# Patient Record
Sex: Male | Born: 1975 | Race: White | Hispanic: No | Marital: Married | Smoking: Never smoker
Health system: Southern US, Community
[De-identification: ages and names within clinical notes are randomized; demographics above are authoritative.]

## PROBLEM LIST (undated history)

## (undated) DIAGNOSIS — E119 Type 2 diabetes mellitus without complications: Secondary | ICD-10-CM

## (undated) DIAGNOSIS — E7800 Pure hypercholesterolemia, unspecified: Secondary | ICD-10-CM

## (undated) DIAGNOSIS — I10 Essential (primary) hypertension: Secondary | ICD-10-CM

---

## 2014-08-13 ENCOUNTER — Emergency Department (HOSPITAL_COMMUNITY): Payer: BC Managed Care – PPO

## 2014-08-13 ENCOUNTER — Encounter (HOSPITAL_COMMUNITY): Payer: Self-pay | Admitting: *Deleted

## 2014-08-13 ENCOUNTER — Emergency Department (HOSPITAL_COMMUNITY)
Admission: EM | Admit: 2014-08-13 | Discharge: 2014-08-13 | Disposition: A | Discharge status: Home / Self Care | Payer: BC Managed Care – PPO | Attending: Emergency Medicine | Admitting: Emergency Medicine

## 2014-08-13 DIAGNOSIS — L03116 Cellulitis of left lower limb: Secondary | ICD-10-CM | POA: Diagnosis present

## 2014-08-13 DIAGNOSIS — I10 Essential (primary) hypertension: Secondary | ICD-10-CM | POA: Insufficient documentation

## 2014-08-13 DIAGNOSIS — E11628 Type 2 diabetes mellitus with other skin complications: Secondary | ICD-10-CM | POA: Insufficient documentation

## 2014-08-13 DIAGNOSIS — L03032 Cellulitis of left toe: Secondary | ICD-10-CM | POA: Diagnosis not present

## 2014-08-13 DIAGNOSIS — L03039 Cellulitis of unspecified toe: Secondary | ICD-10-CM

## 2014-08-13 DIAGNOSIS — L03012 Cellulitis of left finger: Secondary | ICD-10-CM

## 2014-08-13 DIAGNOSIS — L089 Local infection of the skin and subcutaneous tissue, unspecified: Secondary | ICD-10-CM | POA: Diagnosis not present

## 2014-08-13 DIAGNOSIS — L0390 Cellulitis, unspecified: Secondary | ICD-10-CM

## 2014-08-13 HISTORY — DX: Essential (primary) hypertension: I10

## 2014-08-13 HISTORY — DX: Type 2 diabetes mellitus without complications: E11.9

## 2014-08-13 LAB — BASIC METABOLIC PANEL
Anion gap: 7 (ref 5–15)
BUN: 11 mg/dL (ref 6–23)
CALCIUM: 8.9 mg/dL (ref 8.4–10.5)
CO2: 28 mmol/L (ref 19–32)
CREATININE: 0.64 mg/dL (ref 0.50–1.35)
Chloride: 100 mmol/L (ref 96–112)
GFR calc Af Amer: >90 mL/min (ref >90)
Glucose, Bld: 261 mg/dL — ABNORMAL HIGH (ref 70–99)
Potassium: 4.3 mmol/L (ref 3.5–5.1)
Sodium: 135 mmol/L (ref 135–145)

## 2014-08-13 LAB — C-REACTIVE PROTEIN: CRP: 0.5 mg/dL — ABNORMAL LOW (ref <0.60)

## 2014-08-13 LAB — CBC WITH DIFFERENTIAL/PLATELET
Basophils Absolute: 0 10*3/uL (ref 0.0–0.1)
Basophils Relative: 0 % (ref 0–1)
Eosinophils Absolute: 0.1 10*3/uL (ref 0.0–0.7)
Eosinophils Relative: 2 % (ref 0–5)
HCT: 42.7 % (ref 39.0–52.0)
Hemoglobin: 14.9 g/dL (ref 13.0–17.0)
Lymphocytes Relative: 31 % (ref 12–46)
Lymphs Abs: 1.9 10*3/uL (ref 0.7–4.0)
MCH: 29.4 pg (ref 26.0–34.0)
MCHC: 34.9 g/dL (ref 30.0–36.0)
MCV: 84.4 fL (ref 78.0–100.0)
MONOS PCT: 7 % (ref 3–12)
Monocytes Absolute: 0.4 10*3/uL (ref 0.1–1.0)
NEUTROS ABS: 3.8 10*3/uL (ref 1.7–7.7)
Neutrophils Relative %: 60 % (ref 43–77)
Platelets: 248 10*3/uL (ref 150–400)
RBC: 5.06 MIL/uL (ref 4.22–5.81)
RDW: 12.3 % (ref 11.5–15.5)
WBC: 6.3 10*3/uL (ref 4.0–10.5)

## 2014-08-13 LAB — SEDIMENTATION RATE: Sed Rate: 10 mm/hr (ref 0–16)

## 2014-08-13 LAB — CBG MONITORING, ED: Glucose-Capillary: 241 mg/dL — ABNORMAL HIGH (ref 70–99)

## 2014-08-13 MED ORDER — PIPERACILLIN-TAZOBACTAM 3.375 G IVPB
3.3750 g | Freq: Once | INTRAVENOUS | Status: AC
  Administered 2014-08-13: 3.375 g via INTRAVENOUS
  Filled 2014-08-13: qty 50

## 2014-08-13 MED ORDER — LIDOCAINE HCL (PF) 1 % IJ SOLN
5.0000 mL | Freq: Once | INTRAMUSCULAR | Status: AC
  Administered 2014-08-13: 5 mL
  Filled 2014-08-13: qty 5

## 2014-08-13 MED ORDER — AMOXICILLIN-POT CLAVULANATE 875-125 MG PO TABS: 1.0000 | ORAL_TABLET | Freq: Two times a day (BID) | ORAL | Status: DC

## 2014-08-13 MED ORDER — CIPROFLOXACIN HCL 500 MG PO TABS: 500.0000 mg | ORAL_TABLET | Freq: Two times a day (BID) | ORAL | Status: DC

## 2014-08-13 MED ORDER — POVIDONE-IODINE 10 % EX SOLN
CUTANEOUS | Status: AC
  Filled 2014-08-13: qty 118

## 2014-08-13 MED ORDER — METFORMIN HCL 500 MG PO TABS: 500.0000 mg | ORAL_TABLET | Freq: Two times a day (BID) | ORAL | Status: AC

## 2014-08-13 MED ORDER — TETANUS-DIPHTH-ACELL PERTUSSIS 5-2.5-18.5 LF-MCG/0.5 IM SUSP
0.5000 mL | Freq: Once | INTRAMUSCULAR | Status: DC
  Filled 2014-08-13: qty 0.5

## 2014-08-13 MED ORDER — VANCOMYCIN HCL IN DEXTROSE 1-5 GM/200ML-% IV SOLN
1000.0000 mg | Freq: Once | INTRAVENOUS | Status: AC
  Administered 2014-08-13: 1000 mg via INTRAVENOUS
  Filled 2014-08-13: qty 200

## 2014-08-13 NOTE — Consult Note (Addendum)
Hospitalist Consult Note  Patient name: Jared Soto Medical record number: [id number] Date of birth: August 19, 1975 Age: 39 y.o. Gender: male  Primary Care Provider: No primary care provider on file.  Chief Complaint: paronychia, hyperglycemia   History of Present Illness:This is a 40 y.o. year old male with significant past medical history of obesity, type 2 DM presenting with paronychia. Pt states that a piece of plastic got stuck in his toe about 1 month ago. Was fairly stable for a couple of weeks. Reports worsnening redness of toe over past 2 weeks. Has been draining wound at home. No fevers, chills, pain. Has not been on antibiotics. Was previously diagnosed with type 2 diabetes 2 years ago. States that he was previously on medication and was transitioned to diet and lifestyle. States that he has had worsening diet over past 6-12 months. Is a pt at [facility]. Was directed here out of concern for cellulitis/abscess.  Presented to [facility] ER hemodynamically stable, Afebrile. WBC 6.3. Cr 0.64. Glu 261. R Great toe xray WNL-no signs of osteomyelitis. ESR WNL. Nail bed I and D'd at bedside by EDP w/ minimal drainage. Started on IV vanc and zosyn.   Assessment and Plan:  Jared Soto is a 39 y.o. year old male presenting with paronychia, hyperglycemia  Active Problems:   * No active hospital problems. *   1- Paronychia  -S/p manual I and D at bedside  -no elevated temp, leukocystosis, elevated ESR -R great toe xray WNL-no signs of osteo  -s/p IV vanc and zosyn  -discussed overall care w/ pt. He would like to go home. Is deferring admission at this time if at all possible.  -pt also reports that his wife is a home healthy nurse that deals w/ infections and IV management on a regular basis-feels comfortable going home.  -pt is agreeable to follow up first thing tomorrow morning at [facility]  -also discussed care and treatment plan with EDP Rancour  -plan for tx w/ oral  augmentin +/- cipro  -follow up tomorrow am at PCP office  -discussed overall risks and benefits with pt-pt is agreeable to plan   2- Hyperglycemia -noted CBG 261 -bicarb WNL  -start low dose metformin in ER -follow up in am   3- HTN -elevated blood pressures on presentation -intermittent  -asymptomatic -start low dose lisinopril  -UA, urine microalbumin  -follow up w/ PCP in am  -recheck Cr   There are no active problems to display for this patient.  Past Medical History: Past Medical History  Diagnosis Date  . Diabetes mellitus without complication   . Hypertension     Past Surgical History: History reviewed. No pertinent past surgical history.  Social History: History   Social History  . Marital Status: Married    Spouse Name: N/A    Number of Children: N/A  . Years of Education: N/A   Social History Main Topics  . Smoking status: Never Smoker   . Smokeless tobacco: None  . Alcohol Use: No  . Drug Use: No  . Sexual Activity: None   Other Topics Concern  . None   Social History Narrative  . None    Family History: History reviewed. No pertinent family history.  Allergies: No Known Allergies  Current Facility-Administered Medications  Medication Dose Route Frequency Provider Last Rate Last Dose  . povidone-iodine (BETADINE) 10 % external solution           . Tdap (BOOSTRIX) injection 0.5 mL  0.5 mL Intramuscular Once Ezequiel Essex, MD   0.5 mL at 08/13/14 1808   No current outpatient prescriptions on file.   Review Of Systems: 12 point ROS negative except as noted above in HPI.  Physical Exam: Filed Vitals:   08/13/14 1841  BP: 158/90  Pulse: 94  Temp:   Resp: 18    General: alert, cooperative and moderately obese HEENT: PERRLA and extra ocular movement intact Heart: S1, S2 normal, no murmur, rub or gallop, regular rate and rhythm Lungs: clear to auscultation, no wheezes or rales and unlabored breathing Abdomen: abdomen is soft  without significant tenderness, masses, organomegaly or guarding Extremities: 2+ peripheral pulses    Skin:as above  Neurology: normal without focal findings  Labs and Imaging: Lab Results  Component Value Date/Time   NA 135 08/13/2014 04:39 PM   K 4.3 08/13/2014 04:39 PM   CL 100 08/13/2014 04:39 PM   CO2 28 08/13/2014 04:39 PM   BUN 11 08/13/2014 04:39 PM   CREATININE 0.64 08/13/2014 04:39 PM   GLUCOSE 261* 08/13/2014 04:39 PM   Lab Results  Component Value Date   WBC 6.3 08/13/2014   HGB 14.9 08/13/2014   HCT 42.7 08/13/2014   MCV 84.4 08/13/2014   PLT 248 08/13/2014    Dg Toe Great Right  08/13/2014   CLINICAL DATA:  Lead serration of the great toe ulna piece of plastic 1 month ago with persistent symptoms of infection with drainage of pus from under the nail ; patient is a diabetic  EXAM: RIGHT GREAT TOE  COMPARISON:  None.  FINDINGS: The bones of the great toe are adequately mineralized. There is no lytic or blastic lesion. There is no periosteal reaction. No soft tissue gas is demonstrated. The soft tissues exhibit only mild swelling. The interphalangeal and metatarsophalangeal joint are normal.  IMPRESSION: There is no evidence of osteomyelitis. No soft tissue gas is demonstrated either.   Electronically Signed   By: David  Martinique   On: 08/13/2014 16:51           Shanda Howells MD  Pager: [phone]

## 2014-08-13 NOTE — ED Provider Notes (Signed)
CSN: [id number]     Arrival date & time 08/13/14  1515 History   First MD Initiated Contact with Patient 08/13/14 1625     Chief Complaint  Patient presents with  . Cellulitis     (Consider location/radiation/quality/duration/timing/severity/associated sxs/prior Treatment) HPI Comments: Patient states he cut his right great toe on a piece of plastic about one month ago. Over the past week it has been increasingly red and draining yellow pus. Sent from urgent care for IV antibiotics. Patient is a diabetic by his report but does not take any medications because he says his lifestyle improved. Does not check his sugar at home. Denies any chest pain or shortness of breath. Denies abdominal pain, nausea or vomiting. No fever. He has not been on any antibiotics for this wound yet.  The history is provided by the patient.    Past Medical History  Diagnosis Date  . Diabetes mellitus without complication   . Hypertension    History reviewed. No pertinent past surgical history. History reviewed. No pertinent family history. History  Substance Use Topics  . Smoking status: Never Smoker   . Smokeless tobacco: Not on file  . Alcohol Use: No    Review of Systems  Constitutional: Negative for fever, activity change and appetite change.  Respiratory: Negative for cough, chest tightness and shortness of breath.   Cardiovascular: Negative for chest pain.  Gastrointestinal: Positive for nausea. Negative for vomiting and abdominal pain.  Genitourinary: Negative for dysuria, hematuria and testicular pain.  Musculoskeletal: Negative for myalgias and arthralgias.  Skin: Positive for rash and wound.  Neurological: Negative for dizziness and headaches.  A complete 10 system review of systems was obtained and all systems are negative except as noted in the HPI and PMH.      Allergies  Review of patient's allergies indicates no known allergies.  Home Medications   Prior to Admission medications    Medication Sig Start Date End Date Taking? Authorizing Provider  amoxicillin-clavulanate (AUGMENTIN) 875-125 MG per tablet Take 1 tablet by mouth every 12 (twelve) hours. 08/13/14   Ezequiel Essex, MD  ciprofloxacin (CIPRO) 500 MG tablet Take 1 tablet (500 mg total) by mouth 2 (two) times daily. 08/13/14   Ezequiel Essex, MD  metFORMIN (GLUCOPHAGE) 500 MG tablet Take 1 tablet (500 mg total) by mouth 2 (two) times daily with a meal. 08/13/14   Ezequiel Essex, MD   BP 158/90 mmHg  Pulse 94  Temp(Src) 98.2 F (36.8 C) (Oral)  Resp 18  Ht $R'5\' 10"'qV$  (1.778 m)  Wt 264 lb (119.75 kg)  BMI 37.88 kg/m2  SpO2 97% Physical Exam  Constitutional: He is oriented to person, place, and time. He appears well-developed and well-nourished. No distress.  HENT:  Head: Normocephalic and atraumatic.  Mouth/Throat: Oropharynx is clear and moist. No oropharyngeal exudate.  Eyes: Conjunctivae and EOM are normal. Pupils are equal, round, and reactive to light.  Neck: Normal range of motion. Neck supple.  No meningismus.  Cardiovascular: Normal rate, regular rhythm, normal heart sounds and intact distal pulses.   No murmur heard. Pulmonary/Chest: Effort normal and breath sounds normal. No respiratory distress.  Abdominal: Soft. There is no tenderness. There is no rebound and no guarding.  Musculoskeletal: He exhibits edema and tenderness.  Erythema to R great toe extending to base of foot worse around nail bed with questionable area of fluctuance.  Intact DP and PT pulses  Neurological: He is alert and oriented to person, place, and time. No cranial nerve  deficit. He exhibits normal muscle tone. Coordination normal.  No ataxia on finger to nose bilaterally. No pronator drift. 5/5 strength throughout. CN 2-12 intact. Negative Romberg. Equal grip strength. Sensation intact. Gait is normal.   Skin: Skin is warm.  Psychiatric: He has a normal mood and affect. His behavior is normal.  Nursing note and vitals  reviewed.      ED Course  INCISION AND DRAINAGE Date/Time: 08/13/2014 6:00 PM Performed by: Ezequiel Essex Authorized by: Ezequiel Essex Consent: Verbal consent obtained. Risks and benefits: risks, benefits and alternatives were discussed Consent given by: patient Patient understanding: patient states understanding of the procedure being performed Patient consent: the patient's understanding of the procedure matches consent given Procedure consent: procedure consent matches procedure scheduled Relevant documents: relevant documents present and verified Test results: test results available and properly labeled Site marked: the operative site was marked Imaging studies: imaging studies available Patient identity confirmed: provided demographic data and verbally with patient Type: abscess Body area: lower extremity Location details: right big toe Anesthesia: digital block Local anesthetic: lidocaine 1% without epinephrine Anesthetic total: 5 ml Patient sedated: no Scalpel size: 11 Incision type: single straight Complexity: simple Drainage: purulent Drainage amount: scant Patient tolerance: Patient tolerated the procedure well with no immediate complications   (including critical care time) Labs Review Labs Reviewed  BASIC METABOLIC PANEL - Abnormal; Notable for the following:    Glucose, Bld 261 (*)    All other components within normal limits  C-REACTIVE PROTEIN - Abnormal; Notable for the following:    CRP 0.5 (*)    All other components within normal limits  CBG MONITORING, ED - Abnormal; Notable for the following:    Glucose-Capillary 241 (*)    All other components within normal limits  CULTURE, BLOOD (ROUTINE X 2)  CULTURE, BLOOD (ROUTINE X 2)  CBC WITH DIFFERENTIAL/PLATELET  SEDIMENTATION RATE    Imaging Review Dg Toe Great Right  08/13/2014   CLINICAL DATA:  Lead serration of the great toe ulna piece of plastic 1 month ago with persistent symptoms of  infection with drainage of pus from under the nail ; patient is a diabetic  EXAM: RIGHT GREAT TOE  COMPARISON:  None.  FINDINGS: The bones of the great toe are adequately mineralized. There is no lytic or blastic lesion. There is no periosteal reaction. No soft tissue gas is demonstrated. The soft tissues exhibit only mild swelling. The interphalangeal and metatarsophalangeal joint are normal.  IMPRESSION: There is no evidence of osteomyelitis. No soft tissue gas is demonstrated either.   Electronically Signed   By: David  Martinique   On: 08/13/2014 16:51     EKG Interpretation None      MDM   Final diagnoses:  Cellulitis  Diabetic foot infection  Paronychia, left   Cellulitis to right great toe from likely paronychia. Patient is diabetic. No fever or vomiting.  X-ray shows no evidence of osteomyelitis. ESR wnl.  Blood sugar 241 with no DKA. Patient given IV antibiotics. Bedside incision and drainage with minimal purulent discharge.  Tetanus up-to-date. Admission versus observation discussed with patient. His wife is a wound care nurse. He wishes to go home. Discussed with Dr. Ernestina Patches who agrees outpatient treatment seems reasonable as patient has not been on antibiotics yet. We'll start metformin again.  Patient needs follow-up and recheck tomorrow. Advised to return to ED sooner with spreading redness, fever, vomiting or any other concerns. rx augmentin, cipro, metformin for home. Return precautions discussed.  Ezequiel Essex, MD 08/13/14 [phone]

## 2014-08-13 NOTE — Discharge Instructions (Signed)
Cellulitis Take the antibiotics as prescribed. Return to the ED for a recheck tomorrow. Return to the ED sooner with spreading redness, fever, chills or any other concerns. Cellulitis is an infection of the skin and the tissue beneath it. The infected area is usually red and tender. Cellulitis occurs most often in the arms and lower legs.  CAUSES  Cellulitis is caused by bacteria that enter the skin through cracks or cuts in the skin. The most common types of bacteria that cause cellulitis are staphylococci and streptococci. SIGNS AND SYMPTOMS   Redness and warmth.  Swelling.  Tenderness or pain.  Fever. DIAGNOSIS  Your health care provider can usually determine what is wrong based on a physical exam. Blood tests may also be done. TREATMENT  Treatment usually involves taking an antibiotic medicine. HOME CARE INSTRUCTIONS   Take your antibiotic medicine as directed by your health care provider. Finish the antibiotic even if you start to feel better.  Keep the infected arm or leg elevated to reduce swelling.  Apply a warm cloth to the affected area up to 4 times per day to relieve pain.  Take medicines only as directed by your health care provider.  Keep all follow-up visits as directed by your health care provider. SEEK MEDICAL CARE IF:   You notice red streaks coming from the infected area.  Your red area gets larger or turns dark in color.  Your bone or joint underneath the infected area becomes painful after the skin has healed.  Your infection returns in the same area or another area.  You notice a swollen bump in the infected area.  You develop new symptoms.  You have a fever. SEEK IMMEDIATE MEDICAL CARE IF:   You feel very sleepy.  You develop vomiting or diarrhea.  You have a general ill feeling (malaise) with muscle aches and pains. MAKE SURE YOU:   Understand these instructions.  Will watch your condition.  Will get help right away if you are not doing  well or get worse. Document Released: 04/15/2005 Document Revised: 11/20/2013 Document Reviewed: 09/21/2011 [facility] LPExitCare Patient Information 2015 [place], [place]. This information is not intended to replace advice given to you by your health care provider. Make sure you discuss any questions you have with your health care provider.

## 2014-08-13 NOTE — ED Notes (Signed)
Sent from [facility] for eval of rt great toe swelling and redness.

## 2014-08-13 NOTE — ED Notes (Addendum)
Patient states he cut his right big toe on a piece of plastic approximately one month ago. States toe is infected and draining yellow pus. States he went to medical doctor and was sent here for possible IV antibiotics. Patient admits he is diabetic. Removed sterile gauze from patient's right big toe and noted swelling and redness to area with white and yellow pus drainage from skin below nail bed.

## 2014-08-18 LAB — CULTURE, BLOOD (ROUTINE X 2)
CULTURE: NO GROWTH
Culture: NO GROWTH

## 2015-05-20 ENCOUNTER — Emergency Department (HOSPITAL_COMMUNITY)
Admission: EM | Admit: 2015-05-20 | Discharge: 2015-05-20 | Disposition: A | Discharge status: Home / Self Care | Payer: BC Managed Care – PPO | Attending: Emergency Medicine | Admitting: Emergency Medicine

## 2015-05-20 ENCOUNTER — Encounter (HOSPITAL_COMMUNITY): Payer: Self-pay

## 2015-05-20 DIAGNOSIS — R1030 Lower abdominal pain, unspecified: Secondary | ICD-10-CM | POA: Diagnosis present

## 2015-05-20 DIAGNOSIS — L03311 Cellulitis of abdominal wall: Secondary | ICD-10-CM | POA: Diagnosis not present

## 2015-05-20 DIAGNOSIS — I10 Essential (primary) hypertension: Secondary | ICD-10-CM | POA: Insufficient documentation

## 2015-05-20 DIAGNOSIS — Z792 Long term (current) use of antibiotics: Secondary | ICD-10-CM | POA: Diagnosis not present

## 2015-05-20 DIAGNOSIS — L03818 Cellulitis of other sites: Secondary | ICD-10-CM

## 2015-05-20 DIAGNOSIS — E663 Overweight: Secondary | ICD-10-CM | POA: Insufficient documentation

## 2015-05-20 DIAGNOSIS — E119 Type 2 diabetes mellitus without complications: Secondary | ICD-10-CM | POA: Diagnosis not present

## 2015-05-20 DIAGNOSIS — Z79899 Other long term (current) drug therapy: Secondary | ICD-10-CM | POA: Insufficient documentation

## 2015-05-20 LAB — BASIC METABOLIC PANEL
ANION GAP: 9 (ref 5–15)
BUN: 14 mg/dL (ref 6–20)
CHLORIDE: 100 mmol/L — AB (ref 101–111)
CO2: 26 mmol/L (ref 22–32)
Calcium: 8.9 mg/dL (ref 8.9–10.3)
Creatinine, Ser: 0.8 mg/dL (ref 0.61–1.24)
GFR calc Af Amer: >60 mL/min (ref >60)
GFR calc non Af Amer: >60 mL/min (ref >60)
GLUCOSE: 365 mg/dL — AB (ref 65–99)
Potassium: 4.3 mmol/L (ref 3.5–5.1)
SODIUM: 135 mmol/L (ref 135–145)

## 2015-05-20 LAB — CBC WITH DIFFERENTIAL/PLATELET
BASOS ABS: 0 10*3/uL (ref 0.0–0.1)
Basophils Relative: 0 %
Eosinophils Absolute: 0.2 10*3/uL (ref 0.0–0.7)
Eosinophils Relative: 2 %
HCT: 40.7 % (ref 39.0–52.0)
HEMOGLOBIN: 14.4 g/dL (ref 13.0–17.0)
Lymphocytes Relative: 21 %
Lymphs Abs: 2.1 10*3/uL (ref 0.7–4.0)
MCH: 30.5 pg (ref 26.0–34.0)
MCHC: 35.4 g/dL (ref 30.0–36.0)
MCV: 86.2 fL (ref 78.0–100.0)
Monocytes Absolute: 1 10*3/uL (ref 0.1–1.0)
Monocytes Relative: 10 %
Neutro Abs: 6.7 10*3/uL (ref 1.7–7.7)
Neutrophils Relative %: 67 %
PLATELETS: 228 10*3/uL (ref 150–400)
RBC: 4.72 MIL/uL (ref 4.22–5.81)
RDW: 11.9 % (ref 11.5–15.5)
WBC: 10 10*3/uL (ref 4.0–10.5)

## 2015-05-20 MED ORDER — SULFAMETHOXAZOLE-TRIMETHOPRIM 800-160 MG PO TABS: 1.0000 | ORAL_TABLET | Freq: Two times a day (BID) | ORAL | Status: AC

## 2015-05-20 MED ORDER — CEPHALEXIN 500 MG PO CAPS: 500.0000 mg | ORAL_CAPSULE | Freq: Four times a day (QID) | ORAL | Status: DC

## 2015-05-20 MED ORDER — OXYCODONE-ACETAMINOPHEN 5-325 MG PO TABS: 1.0000 | ORAL_TABLET | Freq: Four times a day (QID) | ORAL | Status: DC | PRN

## 2015-05-20 MED ORDER — VANCOMYCIN HCL IN DEXTROSE 1-5 GM/200ML-% IV SOLN
1000.0000 mg | Freq: Once | INTRAVENOUS | Status: AC
  Administered 2015-05-20: 1000 mg via INTRAVENOUS
  Filled 2015-05-20: qty 200

## 2015-05-20 NOTE — Discharge Instructions (Signed)
You were seen today and found to have cellulitis of your suprapubic region. There is no drainable abscess at this time. However, abscess can occur in develop. He will be given antibiotics. You need to complete a close watch on your blood sugars. You also need to monitor the site for worsening redness, increasing pain, persistent fevers at home. If you develop any of these she should be reevaluated immediately.  Cellulitis Cellulitis is an infection of the skin and the tissue beneath it. The infected area is usually red and tender. Cellulitis occurs most often in the arms and lower legs.  CAUSES  Cellulitis is caused by bacteria that enter the skin through cracks or cuts in the skin. The most common types of bacteria that cause cellulitis are staphylococci and streptococci. SIGNS AND SYMPTOMS   Redness and warmth.  Swelling.  Tenderness or pain.  Fever. DIAGNOSIS  Your health care provider can usually determine what is wrong based on a physical exam. Blood tests may also be done. TREATMENT  Treatment usually involves taking an antibiotic medicine. HOME CARE INSTRUCTIONS   Take your antibiotic medicine as directed by your health care provider. Finish the antibiotic even if you start to feel better.  Keep the infected arm or leg elevated to reduce swelling.  Apply a warm cloth to the affected area up to 4 times per day to relieve pain.  Take medicines only as directed by your health care provider.  Keep all follow-up visits as directed by your health care provider. SEEK MEDICAL CARE IF:   You notice red streaks coming from the infected area.  Your red area gets larger or turns dark in color.  Your bone or joint underneath the infected area becomes painful after the skin has healed.  Your infection returns in the same area or another area.  You notice a swollen bump in the infected area.  You develop new symptoms.  You have a fever. SEEK IMMEDIATE MEDICAL CARE IF:   You  feel very sleepy.  You develop vomiting or diarrhea.  You have a general ill feeling (malaise) with muscle aches and pains.   This information is not intended to replace advice given to you by your health care provider. Make sure you discuss any questions you have with your health care provider.   Document Released: 04/15/2005 Document Revised: 03/27/2015 Document Reviewed: 09/21/2011 Elsevier Interactive Patient Education [employer] Inc.

## 2015-05-20 NOTE — ED Provider Notes (Signed)
CSN: [id number]     Arrival date & time 05/20/15  0341 History   First MD Initiated Contact with Patient 05/20/15 0358     Chief Complaint  Patient presents with  . Abscess     (Consider location/radiation/quality/duration/timing/severity/associated sxs/prior Treatment) HPI  This is a 39 year old male with history of diabetes and hypertension who presents with concerns for abscess. Patient reports 2 to three-day history of increasing swelling, pain is suprapubic region. Currently he rates the pain at 5 out of 10. He thought he had a pimple but it has not drained at all. He states that overnight he developed a low-grade temperature to 100.4. He has had increasing pain and redness. He has important court appearances later today and wanted to make sure everything was okay. Patient reports that his blood sugars at baseline are somewhat high. He takes metformin.  Reports that he started taking Keflex yesterday because he had a prescription at home.  Past Medical History  Diagnosis Date  . Diabetes mellitus without complication (HCC)   . Hypertension    History reviewed. No pertinent past surgical history. No family history on file. Social History  Substance Use Topics  . Smoking status: Never Smoker   . Smokeless tobacco: None  . Alcohol Use: No    Review of Systems  Constitutional: Positive for fever.  Respiratory: Negative.  Negative for chest tightness and shortness of breath.   Cardiovascular: Negative.  Negative for chest pain.  Gastrointestinal: Negative.  Negative for abdominal pain.  Genitourinary: Negative.  Negative for dysuria.  Skin: Positive for color change.  All other systems reviewed and are negative.     Allergies  Review of patient's allergies indicates no known allergies.  Home Medications   Prior to Admission medications   Medication Sig Start Date End Date Taking? Authorizing Provider  LOSARTAN POTASSIUM PO Take 50 mg by mouth every morning.   Yes  Historical Provider, MD  amoxicillin-clavulanate (AUGMENTIN) 875-125 MG per tablet Take 1 tablet by mouth every 12 (twelve) hours. 08/13/14   Glynn Octave, MD  cephALEXin (KEFLEX) 500 MG capsule Take 1 capsule (500 mg total) by mouth 4 (four) times daily. 05/20/15   Shon Baton, MD  ciprofloxacin (CIPRO) 500 MG tablet Take 1 tablet (500 mg total) by mouth 2 (two) times daily. 08/13/14   Glynn Octave, MD  metFORMIN (GLUCOPHAGE) 500 MG tablet Take 1 tablet (500 mg total) by mouth 2 (two) times daily with a meal. Patient taking differently: Take 1,000 mg by mouth 2 (two) times daily with a meal.  08/13/14   Glynn Octave, MD  oxyCODONE-acetaminophen (PERCOCET/ROXICET) 5-325 MG tablet Take 1-2 tablets by mouth every 6 (six) hours as needed for severe pain. 05/20/15   Shon Baton, MD  sulfamethoxazole-trimethoprim (BACTRIM DS,SEPTRA DS) 800-160 MG tablet Take 1 tablet by mouth 2 (two) times daily. 05/20/15 05/27/15  Shon Baton, MD   BP 147/95 mmHg  Pulse 103  Temp(Src) 98.6 F (37 C) (Oral)  Resp 16  Ht  (1.778 m)  Wt 250 lb (113.399 kg)  BMI 35.87 kg/m2  SpO2 99% Physical Exam  Constitutional: He is oriented to person, place, and time. He appears well-developed and well-nourished. No distress.  Overweight  HENT:  Head: Normocephalic and atraumatic.  Cardiovascular: Normal rate, regular rhythm and normal heart sounds.   No murmur heard. Pulmonary/Chest: Effort normal and breath sounds normal. No respiratory distress. He has no wheezes.  Abdominal: Soft. Bowel sounds are normal. There is no  tenderness. There is no rebound.  Musculoskeletal: He exhibits no edema.  Neurological: He is alert and oriented to person, place, and time.  Skin: Skin is warm and dry.  Warmth and erythema noted in the suprapubic region with induration just superior to the base of the penis, no fluctuance noted, no crepitus  Psychiatric: He has a normal mood and affect.  Nursing note and  vitals reviewed.   ED Course  Procedures (including critical care time)  EMERGENCY DEPARTMENT US SOFT TISSUE INTERPRETATION "Study: Limited Ultrasound of the noted body part in comments below"  INDICATIONS: Soft tissue infection Multiple views of the body part are obtained with a multi-frequency linear probe  PERFORMED BY:  Myself  IMAGES ARCHIVED?: No  SIDE:Midline  BODY PART:Pelvic wall  FINDINGS: Cellulitis present  LIMITATIONS:  Emergent Procedure  INTERPRETATION:  No abcess noted   cellulitis noted without drainable abscess    Labs Review Labs Reviewed  BASIC METABOLIC PANEL - Abnormal; Notable for the following:    Chloride 100 (*)    Glucose, Bld 365 (*)    All other components within normal limits  CBC WITH DIFFERENTIAL/PLATELET    Imaging Review No results found. I have personally reviewed and evaluated these images and lab results as part of my medical decision-making.   EKG Interpretation None      MDM   Final diagnoses:  Cellulitis of other specified site    Patient presents with concerns for infection in the suprapubic region. Nontoxic on exam. Reports low-grade temperatures at home. 99.6 here. Basic labwork obtained given patient's history of diabetes and low-grade temperatures at home. He has evidence of cellulitis on exam. Bedside ultrasound shows no drainable abscess. There is no crepitus. No evidence of Fournier's. Lab work is reassuring. No leukocytosis or left shift. Patient does have elevated glucose without an anion gap. Patient was given IV vancomycin. Discussed with patient that he is a candidate for outpatient treatment. While there is no treatable abscess at this time, if he develops worsening pain, increasing redness, or decides against a drain, this needs to be reevaluated immediately. If he develops persistence fevers he also needs to be reevaluated. He needs to monitor his blood sugars at home. He will be discharged with Keflex and  Bactrim.  After history, exam, and medical workup I feel the patient has been appropriately medically screened and is safe for discharge home. Pertinent diagnoses were discussed with the patient. Patient was given return precautions.   Shon Batonourtney F Horton, MD 05/20/15 [phone]

## 2015-05-20 NOTE — ED Notes (Signed)
Abscess above my penis in the pubic area per pt.  Keeps getting bigger, it has been draining a little but not much per pt.  Running a low grade fever per pt. I had some keflex when I had a spot on the back o f my head, started it yesterday, but I don't know if it will help.

## 2015-05-21 ENCOUNTER — Emergency Department (HOSPITAL_COMMUNITY)
Admission: EM | Admit: 2015-05-21 | Discharge: 2015-05-21 | Disposition: A | Discharge status: Home / Self Care | Payer: BC Managed Care – PPO | Attending: Emergency Medicine | Admitting: Emergency Medicine

## 2015-05-21 ENCOUNTER — Encounter (HOSPITAL_COMMUNITY): Payer: Self-pay | Admitting: Emergency Medicine

## 2015-05-21 DIAGNOSIS — R110 Nausea: Secondary | ICD-10-CM | POA: Diagnosis not present

## 2015-05-21 DIAGNOSIS — Z792 Long term (current) use of antibiotics: Secondary | ICD-10-CM | POA: Diagnosis not present

## 2015-05-21 DIAGNOSIS — N4821 Abscess of corpus cavernosum and penis: Secondary | ICD-10-CM | POA: Insufficient documentation

## 2015-05-21 DIAGNOSIS — Z4801 Encounter for change or removal of surgical wound dressing: Secondary | ICD-10-CM | POA: Diagnosis present

## 2015-05-21 DIAGNOSIS — E119 Type 2 diabetes mellitus without complications: Secondary | ICD-10-CM | POA: Diagnosis not present

## 2015-05-21 DIAGNOSIS — L0291 Cutaneous abscess, unspecified: Secondary | ICD-10-CM

## 2015-05-21 DIAGNOSIS — Z79899 Other long term (current) drug therapy: Secondary | ICD-10-CM | POA: Diagnosis not present

## 2015-05-21 DIAGNOSIS — N4822 Cellulitis of corpus cavernosum and penis: Secondary | ICD-10-CM | POA: Diagnosis not present

## 2015-05-21 DIAGNOSIS — L0390 Cellulitis, unspecified: Secondary | ICD-10-CM

## 2015-05-21 DIAGNOSIS — I10 Essential (primary) hypertension: Secondary | ICD-10-CM | POA: Diagnosis not present

## 2015-05-21 MED ORDER — HYDROMORPHONE HCL 1 MG/ML IJ SOLN
1.0000 mg | Freq: Once | INTRAMUSCULAR | Status: AC
Start: 2015-05-21 — End: 2015-05-21
  Administered 2015-05-21: 1 mg via INTRAMUSCULAR
  Filled 2015-05-21: qty 1

## 2015-05-21 MED ORDER — POVIDONE-IODINE 10 % EX SOLN
CUTANEOUS | Status: AC
  Filled 2015-05-21: qty 118

## 2015-05-21 MED ORDER — LIDOCAINE-EPINEPHRINE (PF) 1 %-1:200000 IJ SOLN
10.0000 mL | Freq: Once | INTRAMUSCULAR | Status: AC
  Administered 2015-05-21: 10 mL via INTRADERMAL
  Filled 2015-05-21: qty 10

## 2015-05-21 NOTE — ED Notes (Signed)
Pt c/o abscess to groin area has gotten worse and states he is keeping a fever.

## 2015-05-21 NOTE — ED Provider Notes (Signed)
CSN: [id number]     Arrival date & time 05/21/15  1857 History   First MD Initiated Contact with Patient 05/21/15 1904     Chief Complaint  Patient presents with  . Wound Check     (Consider location/radiation/quality/duration/timing/severity/associated sxs/prior Treatment) Patient is a 39 y.o. male presenting with abscess.  Abscess Location:  Ano-genital Size:  2 cm Abscess quality: draining, fluctuance, induration, painful and redness   Red streaking: no   Duration:  4 days Progression:  Worsening Pain details:    Severity:  Mild Associated symptoms: nausea   Associated symptoms: no fatigue and no fever    39 year old male with history of diabetes and hypertension presents to the emergency department today with or days of worsening erythema to area just above dorsal shaft of penis. With some drainage as well. Worse with movement.   Past Medical History  Diagnosis Date  . Diabetes mellitus without complication (HCC)   . Hypertension    History reviewed. No pertinent past surgical history. No family history on file. Social History  Substance Use Topics  . Smoking status: Never Smoker   . Smokeless tobacco: None  . Alcohol Use: No    Review of Systems  Constitutional: Negative for fever and fatigue.  Respiratory: Negative for cough and shortness of breath.   Cardiovascular: Negative for chest pain.  Gastrointestinal: Positive for nausea. Negative for abdominal pain.  Genitourinary: Negative for discharge, penile swelling, scrotal swelling, penile pain and testicular pain.  Skin: Positive for rash (above penis).  All other systems reviewed and are negative.     Allergies  Review of patient's allergies indicates no known allergies.  Home Medications   Prior to Admission medications   Medication Sig Start Date End Date Taking? Authorizing Provider  amoxicillin-clavulanate (AUGMENTIN) 875-125 MG per tablet Take 1 tablet by mouth every 12 (twelve) hours. 08/13/14    Glynn OctaveStephen Rancour, MD  cephALEXin (KEFLEX) 500 MG capsule Take 1 capsule (500 mg total) by mouth 4 (four) times daily. 05/20/15   Shon Batonourtney F Horton, MD  ciprofloxacin (CIPRO) 500 MG tablet Take 1 tablet (500 mg total) by mouth 2 (two) times daily. 08/13/14   Glynn OctaveStephen Rancour, MD  LOSARTAN POTASSIUM PO Take 50 mg by mouth every morning.    Historical Provider, MD  metFORMIN (GLUCOPHAGE) 500 MG tablet Take 1 tablet (500 mg total) by mouth 2 (two) times daily with a meal. Patient taking differently: Take 1,000 mg by mouth 2 (two) times daily with a meal.  08/13/14   Glynn OctaveStephen Rancour, MD  oxyCODONE-acetaminophen (PERCOCET/ROXICET) 5-325 MG tablet Take 1-2 tablets by mouth every 6 (six) hours as needed for severe pain. 05/20/15   Shon Batonourtney F Horton, MD  sulfamethoxazole-trimethoprim (BACTRIM DS,SEPTRA DS) 800-160 MG tablet Take 1 tablet by mouth 2 (two) times daily. 05/20/15 05/27/15  Shon Batonourtney F Horton, MD   BP 156/99 mmHg  Pulse 104  Temp(Src) 97.9 F (36.6 C) (Oral)  Resp 20  Ht 5\' 10"  (1.778 m)  Wt 250 lb (113.399 kg)  BMI 35.87 kg/m2  SpO2 97% Physical Exam  Constitutional: He appears well-developed and well-nourished.  HENT:  Head: Normocephalic and atraumatic.  Neck: Normal range of motion.  Cardiovascular: Normal rate.   Pulmonary/Chest: Effort normal. No respiratory distress.  Abdominal: He exhibits no distension.  Musculoskeletal: Normal range of motion.  Neurological: He is alert.  Skin: There is erythema (significant erythema above penis, induration about 3X4 cm area, no fluctuance. already draining wound midline).  Nursing note and vitals reviewed.  ED Course  .Marland KitchenIncision and Drainage Date/Time: 05/21/2015 11:00 PM Performed by: Marily Memos Authorized by: Marily Memos Consent: Verbal consent obtained. Risks and benefits: risks, benefits and alternatives were discussed Consent given by: patient Required items: required blood products, implants, devices, and special equipment  available Patient identity confirmed: verbally with patient Type: abscess Body area: anogenital (above penis) Local anesthetic: lidocaine 1% with epinephrine Anesthetic total: 2 ml Scalpel size: 11 Incision type: single straight Complexity: simple Drainage: purulent and  bloody Drainage amount: moderate Wound treatment: wound left open Patient tolerance: Patient tolerated the procedure well with no immediate complications   (including critical care time) Labs Review Labs Reviewed  CULTURE, ROUTINE-ABSCESS    Imaging Review No results found. I have personally reviewed and evaluated these images and lab results as part of my medical decision-making.   EKG Interpretation None      MDM   Final diagnoses:  Abscess and cellulitis   39 year old male with diabetes here with cellulitis above his penis. Also with an abscess that started draining. Had another fluid collection slightly right of center then noticed an ultrasound approximate 1.57 and his deep this was needle aspirated under sterile conditions and purulent material was retrieved and sent to lab for wound culture. Secondary to incomplete evacuation I&D was done to express the rest of the purulent material that was also slightly bloody. Patient has only been on appropriate antibiotics for a day and a half (Bactrim and Keflex) and this is a failed outpatient therapy. I discussed with him and his wife he will need to go home and continue antibiotics and if he had any worsening vital signs or was not get better after 2 more doses she would need to return here for likely admission and IV antibiotics. No evidence of Fournier's gangrene at this time appears to be simple cellulitis and not necrotizing.  I have personally and contemperaneously reviewed labs and imaging and used in my decision making as above.   A medical screening exam was performed and I feel the patient has had an appropriate workup for their chief complaint at this  time and likelihood of emergent condition existing is low. They have been counseled on decision, discharge, follow up and which symptoms necessitate immediate return to the emergency department. They or their family verbally stated understanding and agreement with plan and discharged in stable condition.      Marily Memos, MD 05/21/15 [phone]

## 2015-05-24 LAB — CULTURE, ROUTINE-ABSCESS

## 2015-05-25 ENCOUNTER — Telehealth (HOSPITAL_COMMUNITY): Payer: Self-pay

## 2015-05-25 NOTE — Telephone Encounter (Signed)
Positive for MRSA- Treated per protocol. Attempting to contact.

## 2015-05-26 ENCOUNTER — Telehealth (HOSPITAL_COMMUNITY): Payer: Self-pay

## 2015-05-26 NOTE — Telephone Encounter (Signed)
Post ED Visit - Positive Culture Follow-up  Culture report reviewed by antimicrobial stewardship pharmacist:  []  Enzo BiNathan Batchelder, Pharm.D. []  Celedonio MiyamotoJeremy Frens, Pharm.D., BCPS []  Garvin FilaMike Maccia, Pharm.D. [x]  Georgina PillionElizabeth Martin, Pharm.D., BCPS []  [place] MeadeMinh Pham, [address]Pharm[address]D., BCPS, AAHIVP []  Estella HuskMichelle Turner, Pharm.D., BCPS, AAHIVP []  Tennis Mustassie Stewart, Pharm.D. []  Sherle Poeob Vincent, [address]Pharm[address]  Positive abscess culture -> moderate MRSA Treated with Bactrim DS, organism sensitive to the same and no further patient follow-up is required at this time.  Jared Soto, Jared Soto 05/26/2015, 3:11 AM

## 2015-05-26 NOTE — Telephone Encounter (Signed)
Spoke with pt. Informed of labs positive for MRSA. Educated on MRSA. States wound is still draining some but getting better.

## 2016-02-24 ENCOUNTER — Encounter (HOSPITAL_COMMUNITY): Payer: Self-pay | Admitting: *Deleted

## 2016-02-24 ENCOUNTER — Emergency Department (HOSPITAL_COMMUNITY)
Admission: EM | Admit: 2016-02-24 | Discharge: 2016-02-24 | Disposition: A | Discharge status: Home / Self Care | Payer: BC Managed Care – PPO | Attending: Emergency Medicine | Admitting: Emergency Medicine

## 2016-02-24 DIAGNOSIS — Z79899 Other long term (current) drug therapy: Secondary | ICD-10-CM | POA: Insufficient documentation

## 2016-02-24 DIAGNOSIS — I10 Essential (primary) hypertension: Secondary | ICD-10-CM | POA: Insufficient documentation

## 2016-02-24 DIAGNOSIS — M5412 Radiculopathy, cervical region: Secondary | ICD-10-CM | POA: Diagnosis not present

## 2016-02-24 DIAGNOSIS — Z792 Long term (current) use of antibiotics: Secondary | ICD-10-CM | POA: Diagnosis not present

## 2016-02-24 DIAGNOSIS — Z7984 Long term (current) use of oral hypoglycemic drugs: Secondary | ICD-10-CM | POA: Diagnosis not present

## 2016-02-24 DIAGNOSIS — E119 Type 2 diabetes mellitus without complications: Secondary | ICD-10-CM | POA: Insufficient documentation

## 2016-02-24 MED ORDER — HYDROCODONE-ACETAMINOPHEN 5-325 MG PO TABS: 1.0000 | ORAL_TABLET | Freq: Four times a day (QID) | ORAL | 0 refills | Status: DC | PRN

## 2016-02-24 MED ORDER — NAPROXEN 500 MG PO TABS: 500.0000 mg | ORAL_TABLET | Freq: Two times a day (BID) | ORAL | 1 refills | Status: DC

## 2016-02-24 NOTE — Discharge Instructions (Signed)
Neck pain seems to be consistent with a cervical pinched nerve. Or a pinched nerve in your neck. Blood pressure is elevated here continue current blood pressure medicine. Recommend follow-up with the [facility] for the blood pressure in the next 7 days. Return for any new or worse symptoms.  Take the Naprosyn on a regular basis. Continue take your muscle relaxer. Supplement with hydrocodone as needed.

## 2016-02-24 NOTE — ED Provider Notes (Signed)
[facility]-EMERGENCY DEPT Provider Note   CSN: [id number] Arrival date & time: 02/24/16  6962  First Provider Contact:  First MD Initiated Contact with Patient 02/24/16 [phone]        History   Chief Complaint Chief Complaint  Patient presents with  . Hypertension    HPI Jared Soto is a 40 y.o. male.  Patient the with new diagnosis of hypertension patient with a two-week history of left shoulder blade pain that radiates to the left arm with some numbness of the common index finger. Followed by chiropractor for this. Also seen yesterday in urgent care fields probably related to a radiculopathy. Patient had x-rays at the urgent care that showed a naturally fused cervical vertebrae. Patient was noted to be hypertensive at that time and was started on new hypertensive meds for the first time. Patient was started on Norvasc. 5 mg. Took first dose yesterday and took one dose this morning. Patient denies any real chest pain shortness of breath any strokelike symptoms no headache.      Past Medical History:  Diagnosis Date  . Diabetes mellitus without complication (HCC)   . Hypertension     There are no active problems to display for this patient.   History reviewed. No pertinent surgical history.     Home Medications    Prior to Admission medications   Medication Sig Start Date End Date Taking? Authorizing Provider  amLODipine (NORVASC) 5 MG tablet Take 5 mg by mouth daily.   Yes Historical Provider, MD  methocarbamol (ROBAXIN) 500 MG tablet Take 500 mg by mouth 2 (two) times daily.   Yes Historical Provider, MD  amoxicillin-clavulanate (AUGMENTIN) 875-125 MG per tablet Take 1 tablet by mouth every 12 (twelve) hours. 08/13/14   Glynn Octave, MD  cephALEXin (KEFLEX) 500 MG capsule Take 1 capsule (500 mg total) by mouth 4 (four) times daily. 05/20/15   Shon Baton, MD  ciprofloxacin (CIPRO) 500 MG tablet Take 1 tablet (500 mg total) by mouth 2 (two) times daily. 08/13/14   Glynn Octave, MD  HYDROcodone-acetaminophen (NORCO/VICODIN) 5-325 MG tablet Take 1-2 tablets by mouth every 6 (six) hours as needed. 02/24/16   Vanetta Mulders, MD  LOSARTAN POTASSIUM PO Take 50 mg by mouth every morning.    Historical Provider, MD  metFORMIN (GLUCOPHAGE) 500 MG tablet Take 1 tablet (500 mg total) by mouth 2 (two) times daily with a meal. Patient taking differently: Take 1,000 mg by mouth 2 (two) times daily with a meal.  08/13/14   Glynn Octave, MD  naproxen (NAPROSYN) 500 MG tablet Take 1 tablet (500 mg total) by mouth 2 (two) times daily. 02/24/16   Vanetta Mulders, MD  oxyCODONE-acetaminophen (PERCOCET/ROXICET) 5-325 MG tablet Take 1-2 tablets by mouth every 6 (six) hours as needed for severe pain. 05/20/15   Shon Baton, MD    Family History No family history on file.  Social History Social History  Substance Use Topics  . Smoking status: Never Smoker  . Smokeless tobacco: Never Used  . Alcohol use No     Allergies   Review of patient's allergies indicates no known allergies.   Review of Systems Review of Systems  Constitutional: Negative for fever.  HENT: Negative for congestion.   Eyes: Negative for visual disturbance.  Respiratory: Negative for shortness of breath.   Cardiovascular: Negative for chest pain.  Gastrointestinal: Negative for abdominal pain.  Genitourinary: Negative for dysuria.  Musculoskeletal: Positive for neck pain.  Skin: Negative for rash.  Neurological: Positive for numbness. Negative for headaches.  Hematological: Does not bruise/bleed easily.  Psychiatric/Behavioral: Negative for confusion.     Physical Exam Updated Vital Signs BP (!) 181/117   Pulse 112   Temp 97.7 F (36.5 C)   Resp 16   Ht 5\' 10"  (1.778 m)   Wt 122.5 kg   SpO2 98%   BMI 38.74 kg/m   Physical Exam  Constitutional: He is oriented to person, place, and time. He appears well-developed and well-nourished. No distress.  HENT:  Head: Normocephalic and  atraumatic.  Eyes: Conjunctivae and EOM are normal. Pupils are equal, round, and reactive to light.  Neck: Normal range of motion.  Cardiovascular: Normal rate, regular rhythm and normal heart sounds.   Pulmonary/Chest: Effort normal and breath sounds normal.  Abdominal: Soft. Bowel sounds are normal.  Musculoskeletal: Normal range of motion.  Neurological: He is alert and oriented to person, place, and time. No cranial nerve deficit. He exhibits normal muscle tone. Coordination normal.  Skin: Skin is warm. There is erythema.  Nursing note and vitals reviewed.    ED Treatments / Results  Labs (all labs ordered are listed, but only abnormal results are displayed) Labs Reviewed - No data to display  EKG  EKG Interpretation  Date/Time:  Monday February 24 2016 07:02:28 EDT Ventricular Rate:  107 PR Interval:    QRS Duration: 96 QT Interval:  348 QTC Calculation: 465 R Axis:   -11 Text Interpretation:  Sinus tachycardia Abnormal R-wave progression, early transition Left ventricular hypertrophy No previous ECGs available Confirmed by Jaramiah Bossard  MD, Anaih Brander ([id number]) on 02/24/2016 7:23:35 AM       Radiology No results found.  Procedures Procedures (including critical care time)  Medications Ordered in ED Medications - No data to display   Initial Impression / Assessment and Plan / ED Course  I have reviewed the triage vital signs and the nursing notes.  Pertinent labs & imaging results that were available during my care of the patient were reviewed by me and considered in my medical decision making (see chart for details).  Clinical Course    The patient clearly has a history of hypertension. Blood pressure elevated here despite recently starting Norvasc. However is only had 2 doses. One yesterday and one this morning. Started on this that the urgent care. His other symptoms are really consistent with a cervical radicular type pain with affecting the left hand and left arm. No  evidence of any complicating symptoms or complaints is seem to be associated with the hypertension. Patient already on Robaxin. Will be started on anti-inflammatory Naprosyn. Patient has a bili follow-up with the [facility] for both complaints.   Final Clinical Impressions(s) / ED Diagnoses   Final diagnoses:  Essential hypertension  Cervical radiculopathy    New Prescriptions New Prescriptions   HYDROCODONE-ACETAMINOPHEN (NORCO/VICODIN) 5-325 MG TABLET    Take 1-2 tablets by mouth every 6 (six) hours as needed.   NAPROXEN (NAPROSYN) 500 MG TABLET    Take 1 tablet (500 mg total) by mouth 2 (two) times daily.     Vanetta MuldersScott Lashon Beringer, MD 02/24/16 [phone]

## 2016-02-24 NOTE — ED Triage Notes (Signed)
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 First dose taken yesterday and another dose this am, used to take medication for htn but was able to stop taking the medication when he lost weight,

## 2016-09-06 IMAGING — CR DG TOE GREAT 2+V*R*
3 series · 3 of 3 positions shown · non-contrast
Comparison: None.

CLINICAL DATA: Lead serration of the great toe ulna piece of
plastic 1 month ago with persistent symptoms of infection with
drainage of pus from under the nail ; patient is a diabetic

EXAM:
RIGHT GREAT TOE

[ap]
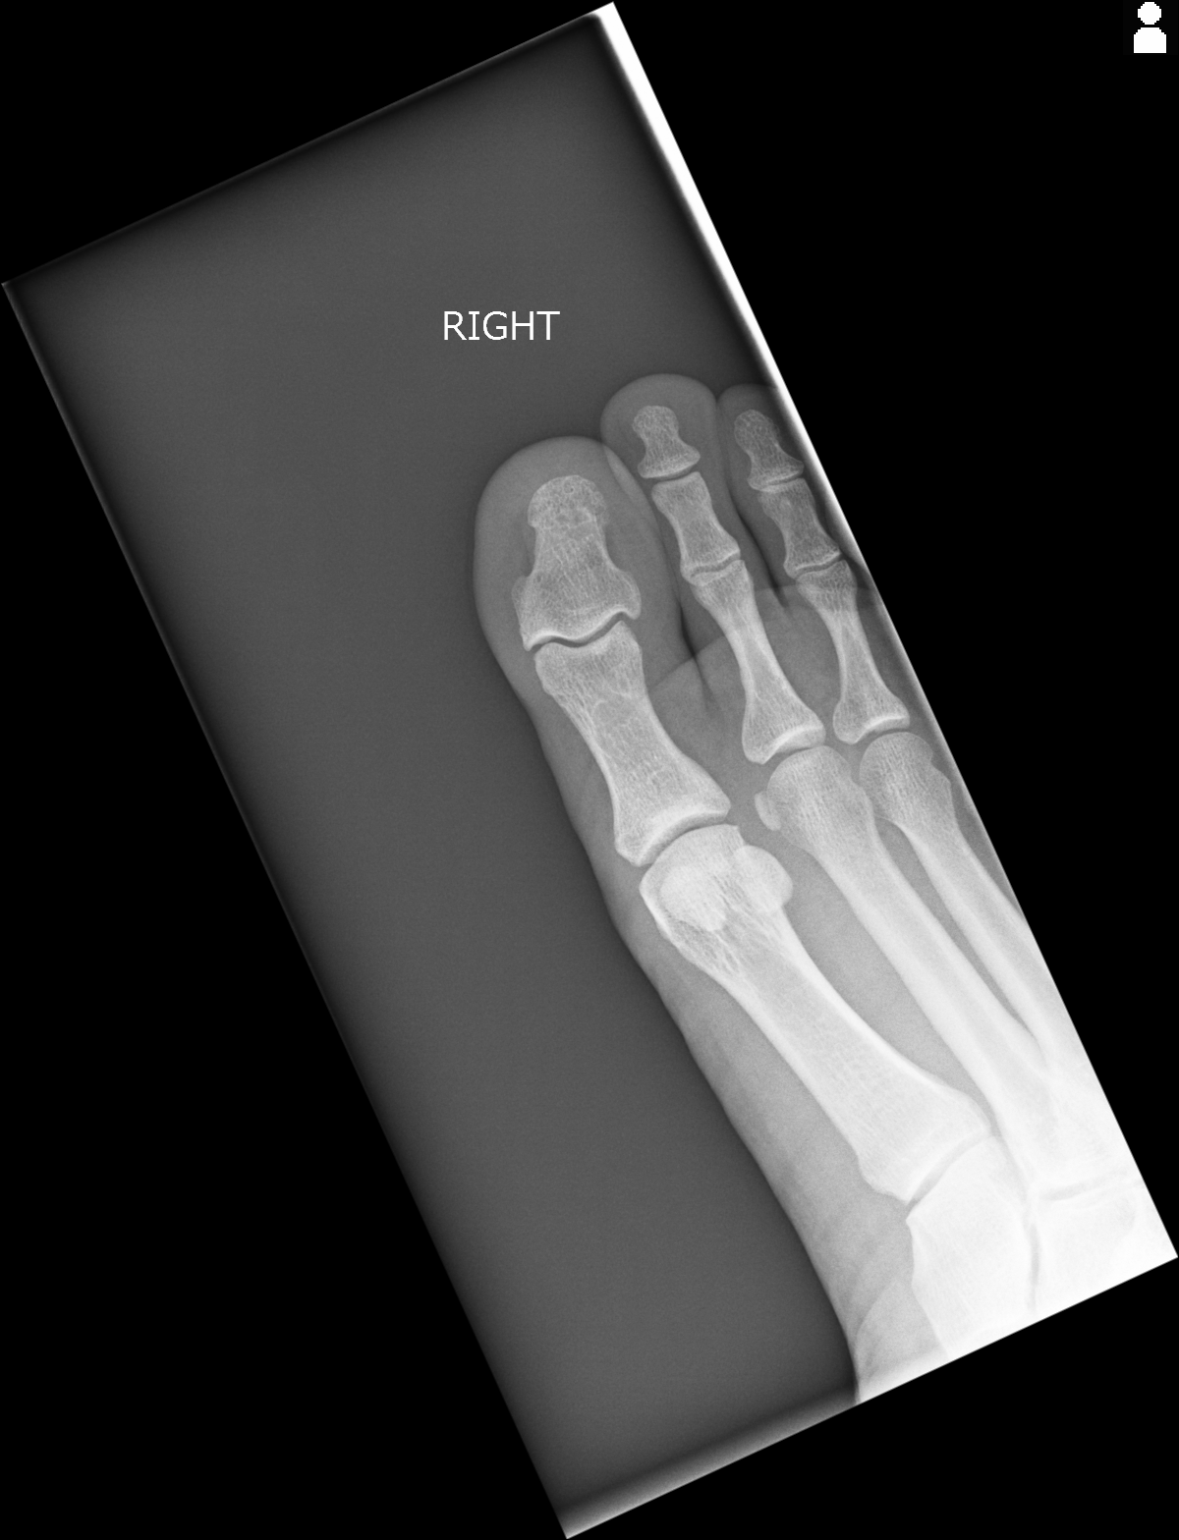

[lat]
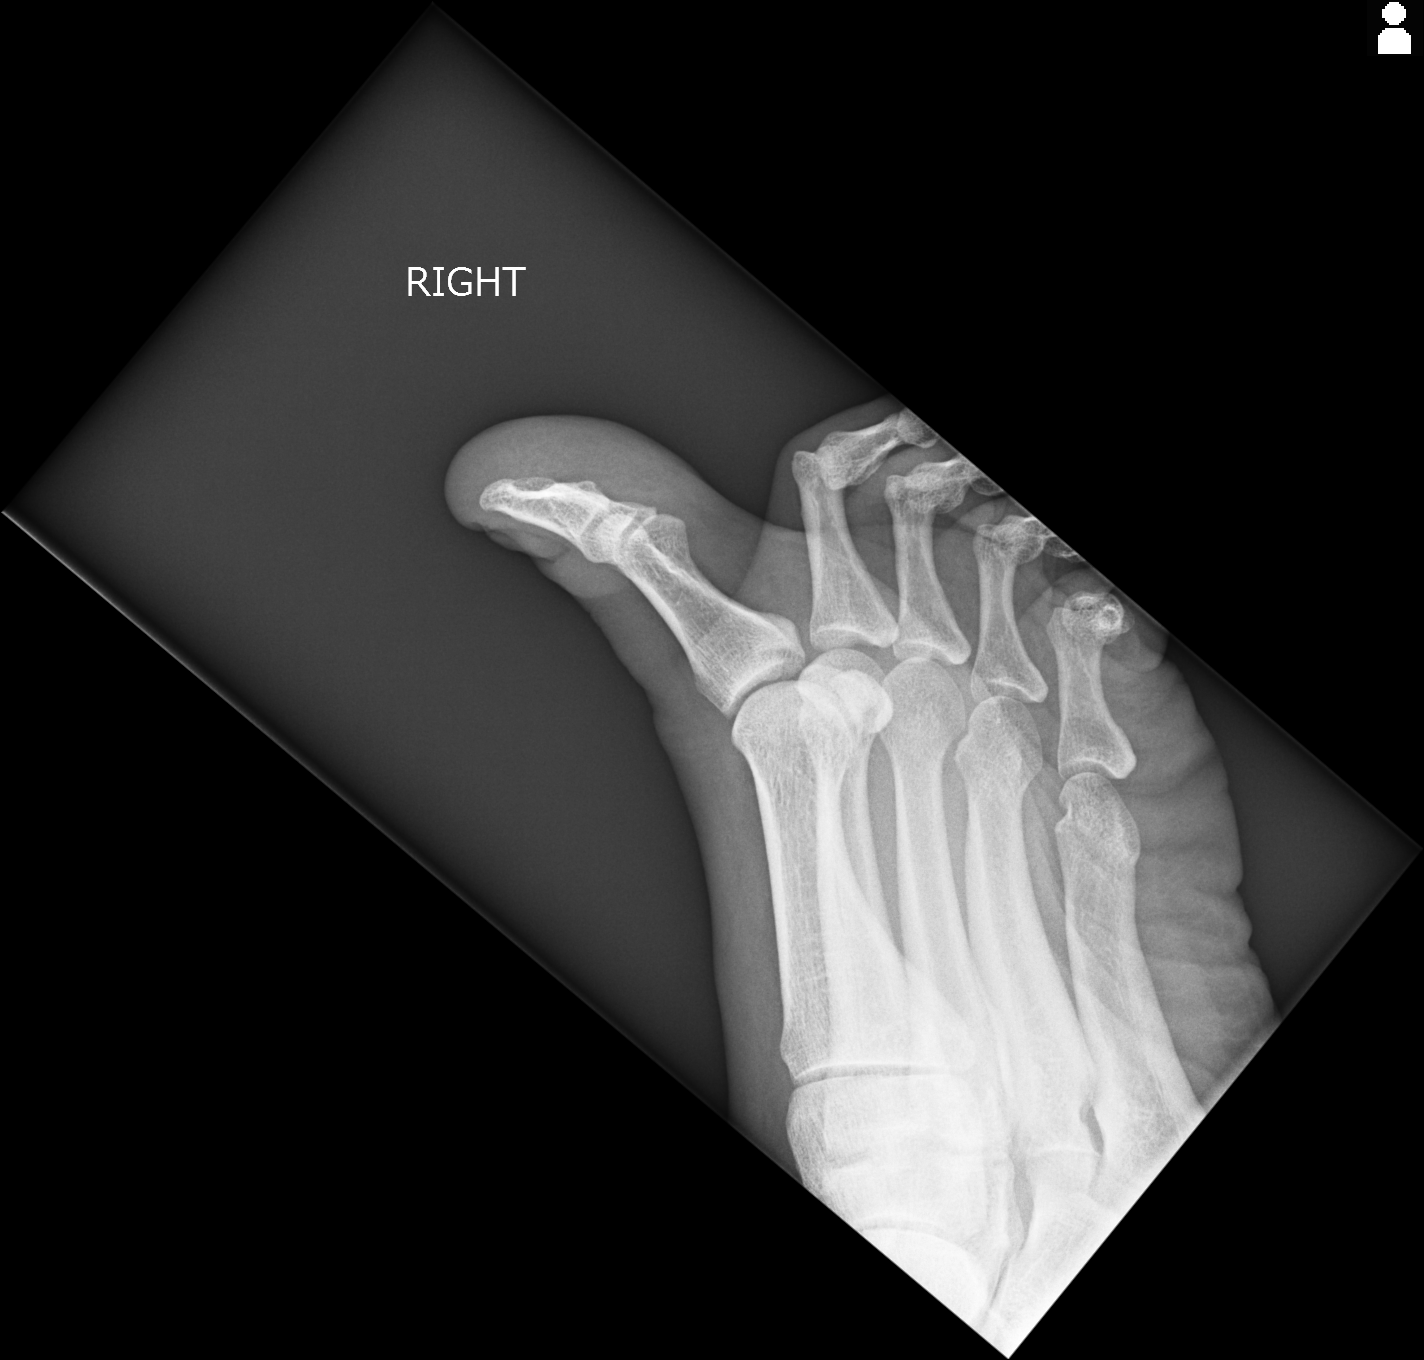

[oblique]
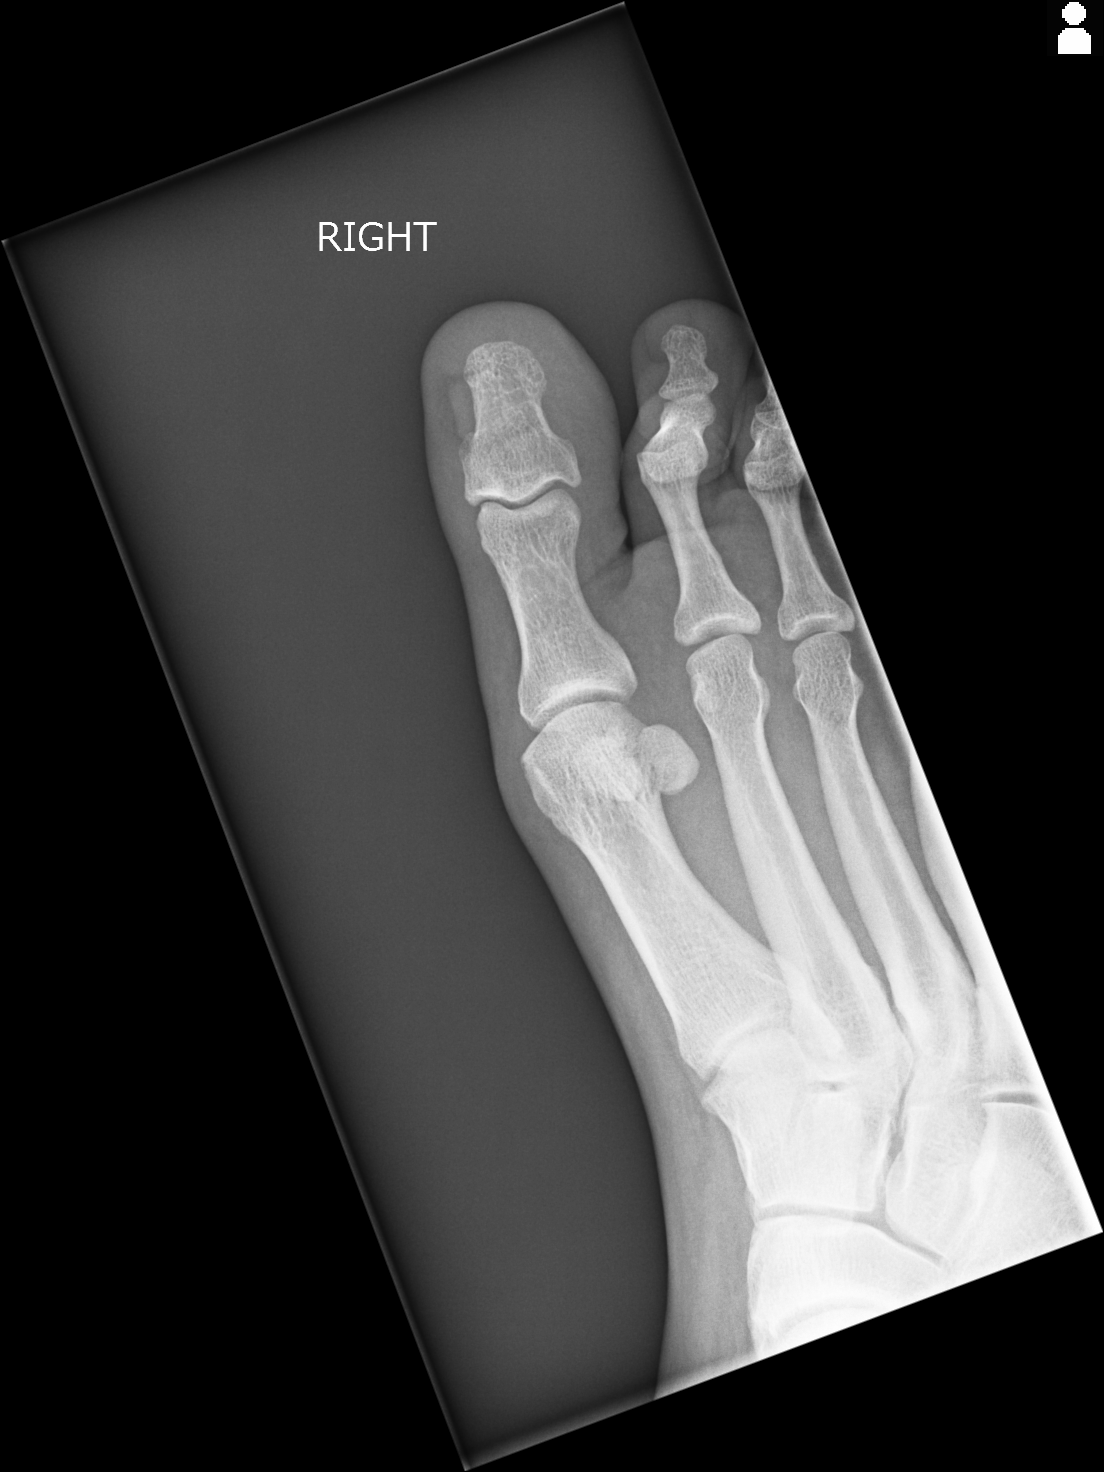

[3 of 3 positions shown; findings below may reference images not displayed]

FINDINGS: The bones of the great toe are adequately mineralized. There is no
lytic or blastic lesion. There is no periosteal reaction. No soft
tissue gas is demonstrated. The soft tissues exhibit only mild
swelling. The interphalangeal and metatarsophalangeal joint are
normal.
IMPRESSION: There is no evidence of osteomyelitis. No soft tissue gas is
demonstrated either.

## 2017-01-30 ENCOUNTER — Emergency Department (HOSPITAL_COMMUNITY): Payer: BC Managed Care – PPO

## 2017-01-30 ENCOUNTER — Emergency Department (HOSPITAL_COMMUNITY)
Admission: EM | Admit: 2017-01-30 | Discharge: 2017-01-31 | Disposition: A | Discharge status: Home / Self Care | Payer: BC Managed Care – PPO | Attending: Emergency Medicine | Admitting: Emergency Medicine

## 2017-01-30 ENCOUNTER — Encounter (HOSPITAL_COMMUNITY): Payer: Self-pay | Admitting: Emergency Medicine

## 2017-01-30 DIAGNOSIS — S6992XA Unspecified injury of left wrist, hand and finger(s), initial encounter: Secondary | ICD-10-CM | POA: Diagnosis present

## 2017-01-30 DIAGNOSIS — Z79899 Other long term (current) drug therapy: Secondary | ICD-10-CM | POA: Diagnosis not present

## 2017-01-30 DIAGNOSIS — Y9389 Activity, other specified: Secondary | ICD-10-CM | POA: Diagnosis not present

## 2017-01-30 DIAGNOSIS — Y929 Unspecified place or not applicable: Secondary | ICD-10-CM | POA: Diagnosis not present

## 2017-01-30 DIAGNOSIS — W208XXA Other cause of strike by thrown, projected or falling object, initial encounter: Secondary | ICD-10-CM | POA: Diagnosis not present

## 2017-01-30 DIAGNOSIS — Z23 Encounter for immunization: Secondary | ICD-10-CM | POA: Insufficient documentation

## 2017-01-30 DIAGNOSIS — Z7984 Long term (current) use of oral hypoglycemic drugs: Secondary | ICD-10-CM | POA: Diagnosis not present

## 2017-01-30 DIAGNOSIS — Y999 Unspecified external cause status: Secondary | ICD-10-CM | POA: Insufficient documentation

## 2017-01-30 DIAGNOSIS — E119 Type 2 diabetes mellitus without complications: Secondary | ICD-10-CM | POA: Insufficient documentation

## 2017-01-30 DIAGNOSIS — I10 Essential (primary) hypertension: Secondary | ICD-10-CM | POA: Diagnosis not present

## 2017-01-30 DIAGNOSIS — S61012A Laceration without foreign body of left thumb without damage to nail, initial encounter: Secondary | ICD-10-CM | POA: Insufficient documentation

## 2017-01-30 DIAGNOSIS — S62515B Nondisplaced fracture of proximal phalanx of left thumb, initial encounter for open fracture: Secondary | ICD-10-CM | POA: Diagnosis not present

## 2017-01-30 HISTORY — DX: Pure hypercholesterolemia, unspecified: E78.00

## 2017-01-30 MED ORDER — POVIDONE-IODINE 10 % EX SOLN
CUTANEOUS | Status: AC
  Administered 2017-01-30: 1
  Filled 2017-01-30: qty 118

## 2017-01-30 MED ORDER — CEPHALEXIN 500 MG PO CAPS: 500.0000 mg | ORAL_CAPSULE | Freq: Four times a day (QID) | ORAL | 0 refills | Status: DC

## 2017-01-30 MED ORDER — CEPHALEXIN 500 MG PO CAPS
500.0000 mg | ORAL_CAPSULE | Freq: Once | ORAL | Status: AC
  Administered 2017-01-30: 500 mg via ORAL
  Filled 2017-01-30: qty 1

## 2017-01-30 MED ORDER — BACITRACIN-NEOMYCIN-POLYMYXIN 400-5-5000 EX OINT
TOPICAL_OINTMENT | Freq: Once | CUTANEOUS | Status: AC
  Administered 2017-01-31: 1 via TOPICAL
  Filled 2017-01-30: qty 1

## 2017-01-30 MED ORDER — HYDROCODONE-ACETAMINOPHEN 5-325 MG PO TABS: 1.0000 | ORAL_TABLET | ORAL | 0 refills | Status: DC | PRN

## 2017-01-30 MED ORDER — TETANUS-DIPHTH-ACELL PERTUSSIS 5-2.5-18.5 LF-MCG/0.5 IM SUSP
0.5000 mL | Freq: Once | INTRAMUSCULAR | Status: AC
  Administered 2017-01-30: 0.5 mL via INTRAMUSCULAR
  Filled 2017-01-30: qty 0.5

## 2017-01-30 MED ORDER — LIDOCAINE HCL (PF) 1 % IJ SOLN
5.0000 mL | Freq: Once | INTRAMUSCULAR | Status: AC
  Administered 2017-01-30: 5 mL
  Filled 2017-01-30: qty 5

## 2017-01-30 NOTE — ED Triage Notes (Signed)
Pt was struck in left thumb by a piece of wood that was thrown from a saw.  Laceration with swelling and bruising

## 2017-01-30 NOTE — ED Provider Notes (Signed)
[facility]-EMERGENCY DEPT Provider Note   CSN: [id number] Arrival date & time: 01/30/17  2138     History   Chief Complaint Chief Complaint  Patient presents with  . Laceration    HPI Jared Soto is a 41 y.o. male who presents to the ED for a laceration to the left thumb. Patient reports that approximately 8:45pm he was sawing wood and a piece of wood flew out and cut his thumb. Is is unsure of his last tetanus.   The history is provided by the patient. No language interpreter was used.  Laceration   The incident occurred 1 to 2 hours ago. The laceration is located on the left hand. The laceration is 3 cm in size. The pain is at a severity of 5/10. The pain has been improving since onset. It is unknown if a foreign body is present. His tetanus status is unknown.    Past Medical History:  Diagnosis Date  . Diabetes mellitus without complication (HCC)   . High cholesterol   . Hypertension     There are no active problems to display for this patient.   History reviewed. No pertinent surgical history.     Home Medications    Prior to Admission medications   Medication Sig Start Date End Date Taking? Authorizing Provider  amLODipine (NORVASC) 5 MG tablet Take 5 mg by mouth daily.    [provider]  amoxicillin-clavulanate (AUGMENTIN) 875-125 MG per tablet Take 1 tablet by mouth every 12 (twelve) hours. 08/13/14   Rancour, Jeannett Senior, MD  cephALEXin (KEFLEX) 500 MG capsule Take 1 capsule (500 mg total) by mouth 4 (four) times daily. 01/30/17   Janne Napoleon, NP  ciprofloxacin (CIPRO) 500 MG tablet Take 1 tablet (500 mg total) by mouth 2 (two) times daily. 08/13/14   Rancour, Jeannett Senior, MD  HYDROcodone-acetaminophen (NORCO/VICODIN) 5-325 MG tablet Take 1 tablet by mouth every 4 (four) hours as needed. 01/30/17   Janne Napoleon, NP  LOSARTAN POTASSIUM PO Take 50 mg by mouth every morning.    [provider]  metFORMIN (GLUCOPHAGE) 500 MG tablet Take 1 tablet (500 mg total)  by mouth 2 (two) times daily with a meal. Patient taking differently: Take 1,000 mg by mouth 2 (two) times daily with a meal.  08/13/14   Rancour, Jeannett Senior, MD  methocarbamol (ROBAXIN) 500 MG tablet Take 500 mg by mouth 2 (two) times daily.    [provider]  naproxen (NAPROSYN) 500 MG tablet Take 1 tablet (500 mg total) by mouth 2 (two) times daily. 02/24/16   Vanetta Mulders, MD  oxyCODONE-acetaminophen (PERCOCET/ROXICET) 5-325 MG tablet Take 1-2 tablets by mouth every 6 (six) hours as needed for severe pain. 05/20/15   Horton, Mayer Masker, MD    Family History History reviewed. No pertinent family history.  Social History Social History  Substance Use Topics  . Smoking status: Never Smoker  . Smokeless tobacco: Never Used  . Alcohol use No     Allergies   Patient has no known allergies.   Review of Systems Review of Systems  Gastrointestinal: Negative for nausea.  Musculoskeletal: Positive for arthralgias.       Thumb  Skin: Positive for color change and wound.  Neurological: Negative for syncope.  Psychiatric/Behavioral: The patient is not nervous/anxious.      Physical Exam Updated Vital Signs BP 124/82 (BP Location: Left Arm)   Pulse 90   Temp 98.1 F (36.7 C)   Resp 16   Ht 5\' 10"  (  1.778 m)   Wt 111.1 kg (245 lb)   SpO2 95%   BMI 35.15 kg/m   Physical Exam  Constitutional: He appears well-developed and well-nourished. No distress.  HENT:  Head: Normocephalic.  Eyes: EOM are normal.  Neck: Neck supple.  Cardiovascular: Normal rate.   Pulmonary/Chest: Effort normal.  Musculoskeletal:       Left hand: He exhibits tenderness and laceration. He exhibits no deformity. Normal strength noted. He exhibits no thumb/finger opposition.       Hands: 2 cm laceration to the dorsum of the left thumb at the IP. Full range of motion, good strength.   Neurological: He is alert. He has normal strength. A cranial nerve deficit is present. No sensory deficit.  Skin:  Skin is warm and dry.  Psychiatric: He has a normal mood and affect. His behavior is normal.  Nursing note and vitals reviewed.    ED Treatments / Results  Labs (all labs ordered are listed, but only abnormal results are displayed) Labs Reviewed - No data to display  Radiology Dg Finger Thumb Left  Result Date: 01/30/2017 CLINICAL DATA:  Laceration with swelling and bruising EXAM: LEFT THUMB 2+V COMPARISON:  None. FINDINGS: Faint linear lucency at the head of the first proximal phalanx is suspicious for a nondisplaced fracture. No subluxation. No radiopaque foreign body. IMPRESSION: Findings suspicious for nondisplaced fracture involving the head of the first proximal phalanx, without definite articular extension. Electronically Signed   By: Jasmine PangKim  Fujinaga M.D.   On: 01/30/2017 22:40    Procedures .Marland Kitchen.Laceration Repair Date/Time: 01/30/2017 11:58 PM Performed by: Janne NapoleonNEESE, Jaivon Vanbeek M Authorized by: Janne NapoleonNEESE, Chantalle Defilippo M   Consent:    Consent obtained:  Verbal   Consent given by:  Patient   Risks discussed:  Infection, pain and poor wound healing Anesthesia (see MAR for exact dosages):    Anesthesia method:  Local infiltration   Local anesthetic:  Lidocaine 1% w/o epi Laceration details:    Location:  Finger   Finger location:  L thumb   Length (cm):  2 Repair type:    Repair type:  Simple Pre-procedure details:    Preparation:  Patient was prepped and draped in usual sterile fashion and imaging obtained to evaluate for foreign bodies Exploration:    Hemostasis achieved with:  Direct pressure   Wound exploration: wound explored through full range of motion and entire depth of wound probed and visualized     Wound extent: underlying fracture     Wound extent: no tendon damage noted     Contaminated: no   Treatment:    Area cleansed with:  Betadine and saline   Amount of cleaning:  Extensive   Irrigation solution:  Sterile saline   Irrigation method:  Syringe Skin repair:    Repair  method:  Sutures   Suture size:  5-0   Suture material:  Prolene   Number of sutures:  3 Approximation:    Approximation:  Loose Post-procedure details:    Dressing:  Antibiotic ointment, non-adherent dressing and sterile dressing   Patient tolerance of procedure:  Tolerated well, no immediate complications Comments:     Tetanus updated, antibiotics started, splint applied.    (including critical care time)  Medications Ordered in ED Medications  lidocaine (PF) (XYLOCAINE) 1 % injection 5 mL (5 mLs Infiltration Given 01/30/17 2313)  Tdap (BOOSTRIX) injection 0.5 mL (0.5 mLs Intramuscular Given 01/30/17 2312)  cephALEXin (KEFLEX) capsule 500 mg (500 mg Oral Given 01/30/17 2313)  povidone-iodine (BETADINE)  10 % external solution (1 application  Given 01/30/17 2339)  neomycin-bacitracin-polymyxin (NEOSPORIN) ointment (1 application Topical Given 01/31/17 0007)     Initial Impression / Assessment and Plan / ED Course  I have reviewed the triage vital signs and the nursing notes.  Pertinent imaging results that were available during my care of the patient were reviewed by me and considered in my medical decision making. Patient stable for d/c without focal neuro deficits.  Final Clinical Impressions(s) / ED Diagnoses   Final diagnoses:  Laceration of left thumb without foreign body without damage to nail, initial encounter  Open nondisplaced fracture of proximal phalanx of left thumb, initial encounter    New Prescriptions Discharge Medication List as of 01/30/2017 11:58 PM       Janne Napoleon, NP 01/31/17 Ander Purpura, MD 01/31/17 [phone]

## 2017-01-31 NOTE — ED Notes (Signed)
Pt states understanding of care given and follow up instructions.  Pt a/o ambulated from ED with steady gait 

## 2019-02-24 IMAGING — DX DG FINGER THUMB 2+V*L*
3 series · 3 of 3 positions shown · non-contrast
Comparison: None.

CLINICAL DATA: Laceration with swelling and bruising

EXAM:
LEFT THUMB 2+V

[finger ap]
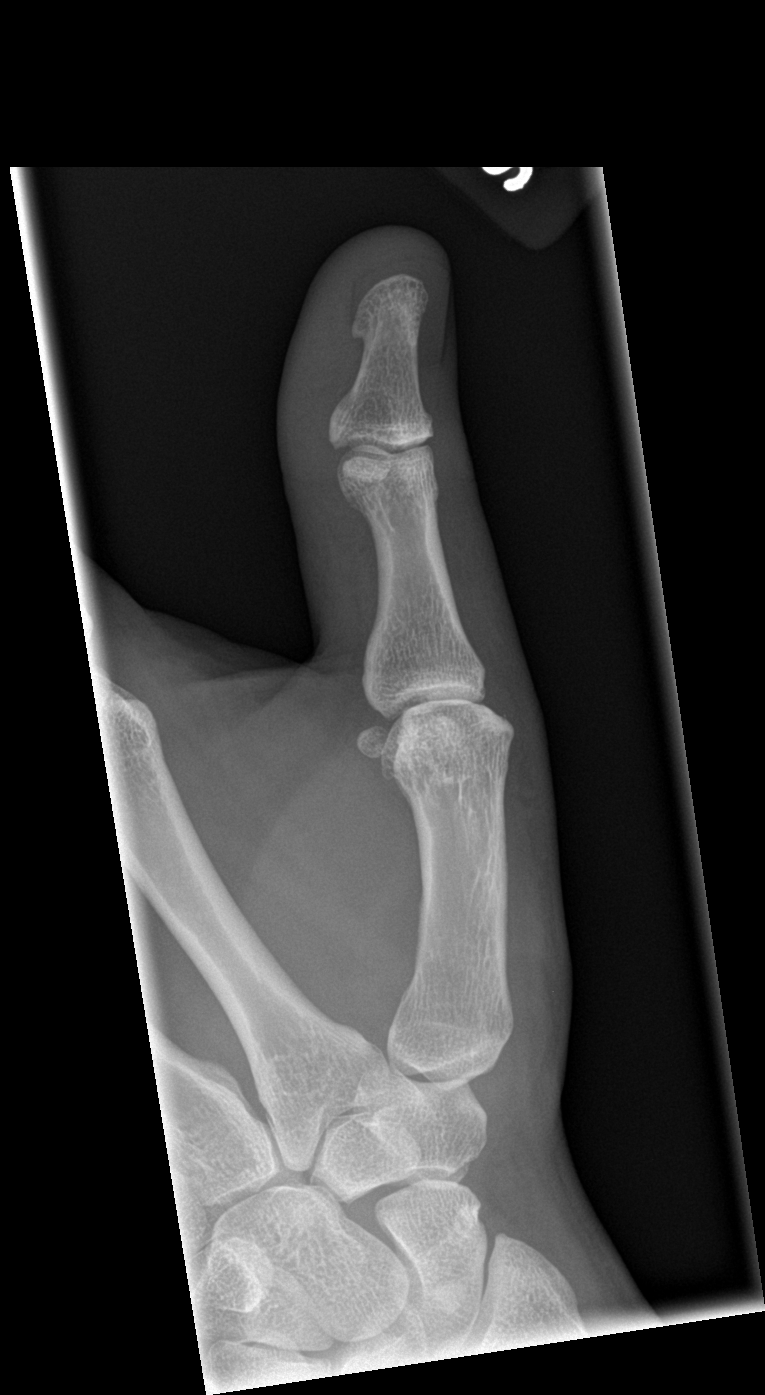

[finger obl]
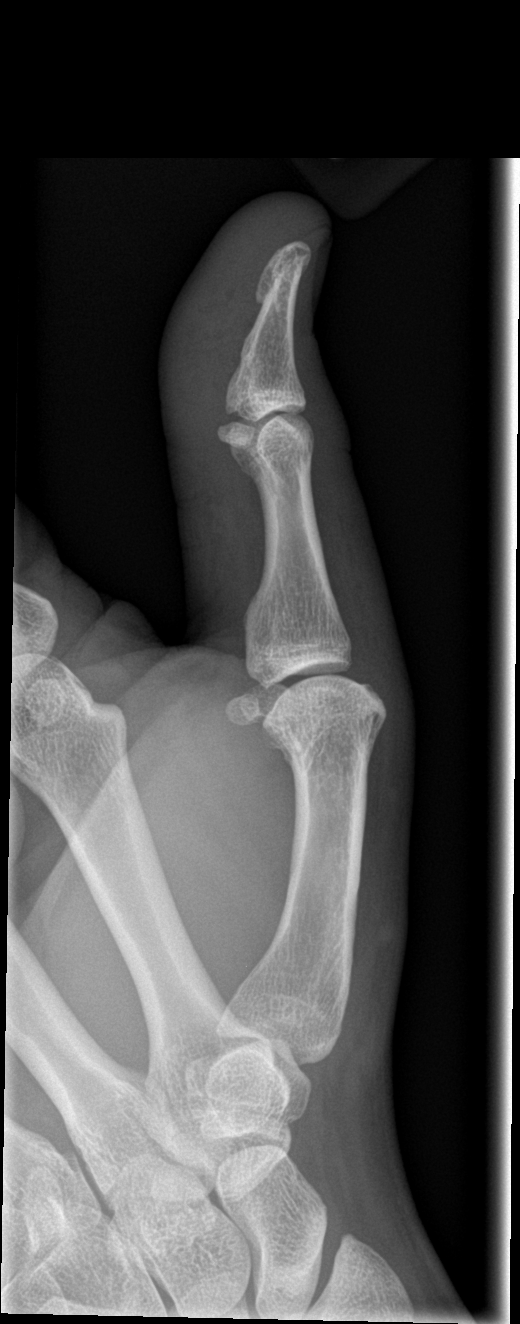

[finger lat]
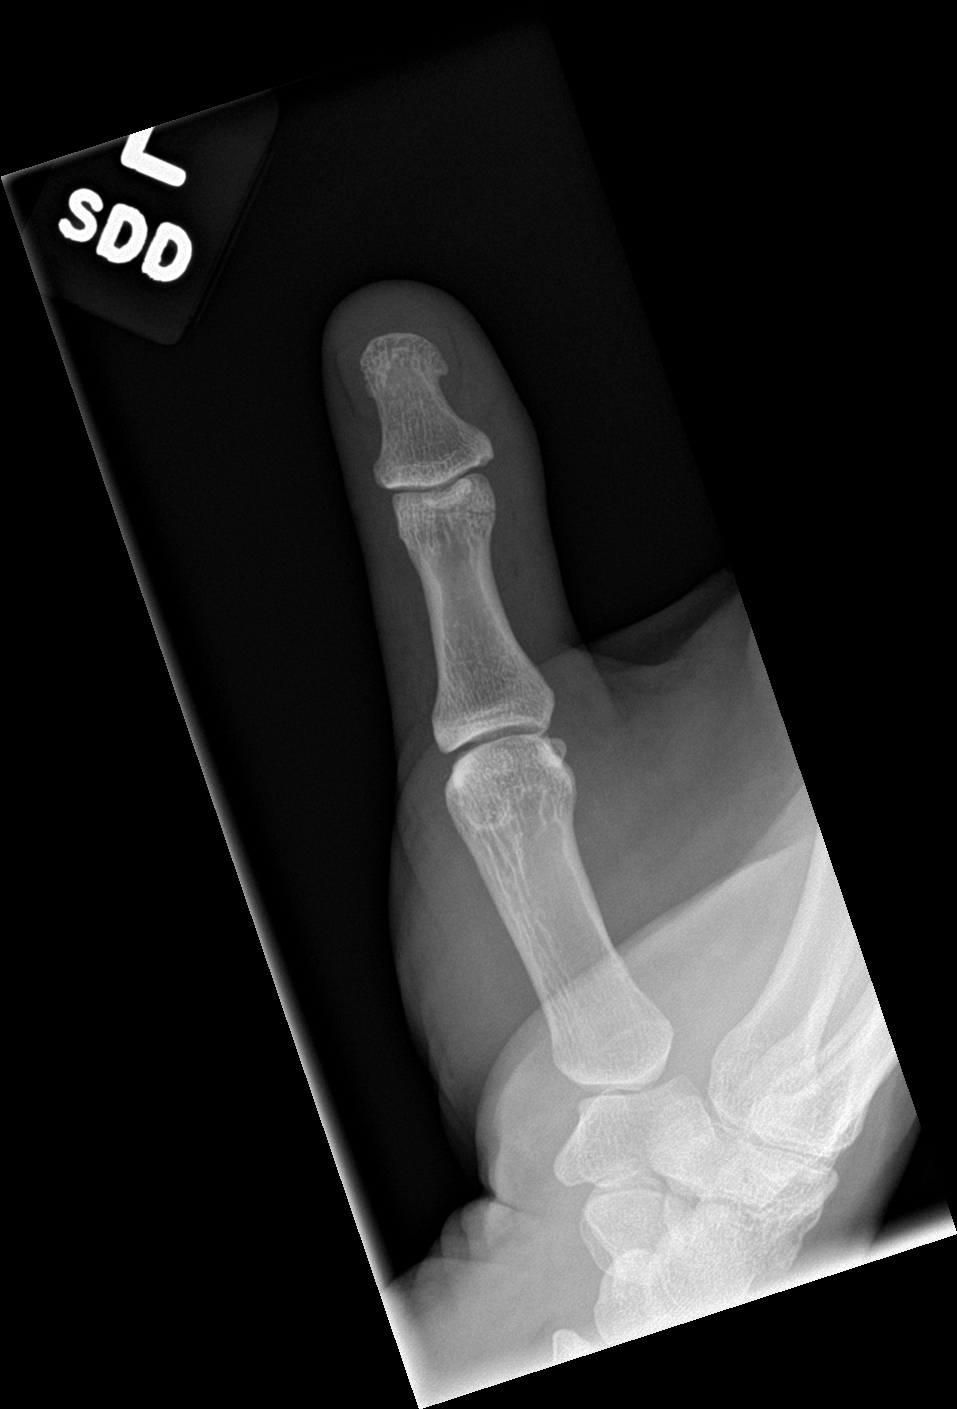

[3 of 3 positions shown; findings below may reference images not displayed]

FINDINGS: Faint linear lucency at the head of the first proximal phalanx is
suspicious for a nondisplaced fracture. No subluxation. No
radiopaque foreign body.
IMPRESSION: Findings suspicious for nondisplaced fracture involving the head of
the first proximal phalanx, without definite articular extension.

## 2020-10-15 ENCOUNTER — Encounter (INDEPENDENT_AMBULATORY_CARE_PROVIDER_SITE_OTHER): Payer: Self-pay | Admitting: *Deleted

## 2021-04-08 ENCOUNTER — Encounter (INDEPENDENT_AMBULATORY_CARE_PROVIDER_SITE_OTHER): Payer: Self-pay | Admitting: *Deleted

## 2021-07-07 ENCOUNTER — Encounter (INDEPENDENT_AMBULATORY_CARE_PROVIDER_SITE_OTHER): Payer: Self-pay | Admitting: *Deleted

## 2021-08-21 ENCOUNTER — Encounter (INDEPENDENT_AMBULATORY_CARE_PROVIDER_SITE_OTHER): Payer: Self-pay | Admitting: Gastroenterology

## 2021-08-21 ENCOUNTER — Ambulatory Visit (INDEPENDENT_AMBULATORY_CARE_PROVIDER_SITE_OTHER): Payer: BC Managed Care – PPO | Admitting: Gastroenterology

## 2021-08-21 ENCOUNTER — Other Ambulatory Visit: Payer: Self-pay

## 2021-08-21 VITALS — BP 157/96 | HR 93 | Temp 98.2°F | Ht 70.0 in | Wt 279.6 lb

## 2021-08-21 DIAGNOSIS — K629 Disease of anus and rectum, unspecified: Secondary | ICD-10-CM | POA: Diagnosis not present

## 2021-08-21 NOTE — Patient Instructions (Signed)
After examination of the lesion in your rectum today, it does not appear to be anything that would be concerning for malignancy, especially given your lack of associated symptoms. If you develop any pain, drainage, bleeding or the lesion becomes larger or changes in any way, please let me know. Please continue to use wet wipes when cleaning after a BM, wear 100%cotton underwear and you can use a cornstarch based powder to help with extra moisture (caldescene is a good one that can be bought over the counter, usually found in the baby section)  Follow up in 6 months

## 2021-08-21 NOTE — Progress Notes (Signed)
Referring Provider: [facility]* Primary Care Physician:  [facility] Primary GI Physician: new patient  Chief Complaint  Patient presents with   Rectal fistula    Patient here today due to a spot on his rectal area. He states the area rough, no pain or discomfort, nor rectal bleeding. Per patient wife the area is a different color from the rest of the skin surrounding it.    HPI:   Jared Soto is a 46 y.o. male with past medical history of DM, high cholesterol and HTN.   Patient presenting today as new patient referred by [facility] for concern for possible rectal fistula.  Patient States that he noticed a spot near his anus about 1 year ago in the shower when washing off, he felt a rough spot in perianal area. saw PCP who sent him for referral for concern of possible scar tissue related to possible past anal fistula. He States that his wife is a Marine scientist and is very concerned about the spot. He denies any rectal pain or bleeding. Denies any issues with constipation. He has had no previous anal/rectal fistulas that he is aware of. No bleeding, pain or drainage from the lesion. States that lesion has not changed in size, color or texture since it was first discovered. He does take metformin which sometimes causes diarrhea. Has no issues with constipation or having a BM. He denies any history of CRC or IBD in his family. He denies any weight loss, changes in appetite.    NSAID use:no NSAID use on regular basis. Social hx: no etoh or tobacco Fam hx:no CRC or IBD Last Colonoscopy:never Last Endoscopy:never  Past Medical History:  Diagnosis Date   Diabetes mellitus without complication ([place])    High cholesterol    Hypertension     History reviewed. No pertinent surgical history.  Current Outpatient Medications  Medication Sig Dispense Refill   amLODipine (NORVASC) 5 MG tablet Take 5 mg by mouth daily.     baclofen (LIORESAL) 10  MG tablet 1 TABLET EVERY 8 HOURS, AND 2 AT BEDTIME     LOSARTAN POTASSIUM PO Take 50 mg by mouth every morning.     metFORMIN (GLUCOPHAGE) 500 MG tablet Take 1 tablet (500 mg total) by mouth 2 (two) times daily with a meal. 60 tablet 0   No current facility-administered medications for this visit.    Allergies as of 08/21/2021   (No Known Allergies)    History reviewed. No pertinent family history.  Social History   Socioeconomic History   Marital status: Married    Spouse name: Not on file   Number of children: Not on file   Years of education: Not on file   Highest education level: Not on file  Occupational History   Not on file  Tobacco Use   Smoking status: Never   Smokeless tobacco: Never  Vaping Use   Vaping Use: Never used  Substance and Sexual Activity   Alcohol use: No   Drug use: No   Sexual activity: Not on file  Other Topics Concern   Not on file  Social History Narrative   Not on file   Social Determinants of Health   Financial Resource Strain: Not on file  Food Insecurity: Not on file  Transportation Needs: Not on file  Physical Activity: Not on file  Stress: Not on file  Social Connections: Not on file   Review of systems General: negative  for malaise, night sweats, fever, chills, weight loss Neck: Negative for lumps, goiter, pain and significant neck swelling Resp: Negative for cough, wheezing, dyspnea at rest CV: Negative for chest pain, leg swelling, palpitations, orthopnea GI: denies melena, hematochezia, nausea, vomiting, diarrhea, constipation, dysphagia, odyonophagia, early satiety or unintentional weight loss. +perianal lesion MSK: Negative for joint pain or swelling, back pain, and muscle pain. Derm: Negative for itching or rash Psych: Denies depression, anxiety, memory loss, confusion. No homicidal or suicidal ideation.  Heme: Negative for prolonged bleeding, bruising easily, and swollen nodes. Endocrine: Negative for cold or heat  intolerance, polyuria, polydipsia and goiter. Neuro: negative for tremor, gait imbalance, syncope and seizures. The remainder of the review of systems is noncontributory.  Physical Exam: BP (!) 157/96 (BP Location: Left Arm, Patient Position: Sitting, Cuff Size: Large)    Pulse 93    Temp 98.2 F (36.8 C) (Oral)    Ht 5\' 10"  (1.778 m)    Wt 279 lb 9.6 oz (126.8 kg)    BMI 40.12 kg/m  General:   Alert and oriented. No distress noted. Pleasant and cooperative.  Head:  Normocephalic and atraumatic. Eyes:  Conjuctiva clear without scleral icterus. Mouth:  Oral mucosa pink and moist. Good dentition. No lesions. Heart: Normal rate and rhythm, s1 and s2 heart sounds present.  Lungs: Clear lung sounds in all lobes. Respirations equal and unlabored. Abdomen:  +BS, soft, non-tender and non-distended. No rebound or guarding. No HSM or masses noted. Rectal: **crystal sutton, CMA present during exam. Small (approx 64mm in size, white perianal cryptic lesion, posterior midline without tenderness, drainage or bleeding,  Derm: No palmar erythema or jaundice Msk:  Symmetrical without gross deformities. Normal posture. Extremities:  Without edema. Neurologic:  Alert and  oriented x4 Psych:  Alert and cooperative. Normal mood and affect.  Invalid input(s): 6 MONTHS   ASSESSMENT: Jared Soto is a 46 y.o. male presenting today for perianal lesion.   Small, white, non tender, non bleeding, non draining perianal lesion in posterior midline, present x1 year without any changes in size or texture. Patient denies any hx of anal fistual, no rectal bleeding, no family history of IBD or CRC. He has no pain or difficulty defecating. I discussed case with Dr. Laural Golden, who also observed lesion. There is a low liklihood that this could be related to a malignant etiology given lack of changes in lesion since discovery as well as lack of alarm symptoms. He will make me aware of any changes in size or any new symptoms  associated with the lesion, with plans to see him back in 6 months for re-evaluation of area.   PLAN:  Continue to keep rectal area clean and dry 2. Use wet wipes after BM 3.  Can apply cornstarch based powder to keep moisture at minimum 4. Wear 100% cotton underwear to help with moisture 5. Patient to make me aware if lesion changes, he has any new associated symptoms with it.    Follow Up: 6 months  Vallie Fayette L. Alver Sorrow, MSN, APRN, AGNP-C Adult-Gerontology Nurse Practitioner [facility]

## 2021-08-24 DIAGNOSIS — K629 Disease of anus and rectum, unspecified: Secondary | ICD-10-CM | POA: Insufficient documentation

## 2022-01-07 ENCOUNTER — Encounter (INDEPENDENT_AMBULATORY_CARE_PROVIDER_SITE_OTHER): Payer: Self-pay | Admitting: Gastroenterology

## 2022-02-19 ENCOUNTER — Ambulatory Visit (INDEPENDENT_AMBULATORY_CARE_PROVIDER_SITE_OTHER): Payer: Self-pay | Admitting: Gastroenterology

## 2022-02-26 ENCOUNTER — Ambulatory Visit (INDEPENDENT_AMBULATORY_CARE_PROVIDER_SITE_OTHER): Payer: Self-pay | Admitting: Gastroenterology

## 2022-09-28 ENCOUNTER — Telehealth: Payer: Self-pay | Admitting: Gastroenterology

## 2022-09-28 NOTE — Telephone Encounter (Signed)
Hi Dr. Lyndel Safe,  We received a referral for this patient for a colonoscopy.  Patient saw [facility] once in 2023 (records in [place]).  Patient is requesting [facility] because his PCP referred him to our office and it is closer for him.  Please review and advise transfer of care.  Thanks  Dr. Lyndel Safe Supervising MD 09/28/22

## 2022-09-29 NOTE — Telephone Encounter (Signed)
OK to proceed with colonoscopy (MiraLAX prep) directly for screening RG

## 2022-11-04 ENCOUNTER — Encounter: Payer: Self-pay | Admitting: Emergency Medicine

## 2023-09-30 ENCOUNTER — Encounter (INDEPENDENT_AMBULATORY_CARE_PROVIDER_SITE_OTHER): Payer: Self-pay | Admitting: *Deleted

## 2024-04-03 ENCOUNTER — Encounter (INDEPENDENT_AMBULATORY_CARE_PROVIDER_SITE_OTHER): Payer: Self-pay | Admitting: *Deleted

## 2024-08-23 ENCOUNTER — Telehealth (INDEPENDENT_AMBULATORY_CARE_PROVIDER_SITE_OTHER): Payer: Self-pay

## 2024-08-23 NOTE — Telephone Encounter (Signed)
 Ok to schedule.  Room Any  Hold ozempic as per protocol   Thanks,  Emaly Boschert Faizan Statia Burdick, MD Gastroenterology and Hepatology [facility]

## 2024-08-23 NOTE — Telephone Encounter (Signed)
 Who is your primary care physician: Mariano Lindau  Reasons for the colonoscopy: screening, family history of colon cancer  Have you had a colonoscopy before?  no  Do you have family history of colon cancer? Yes, maternal grandfather  Previous colonoscopy with polyps removed? no  Do you have a history colorectal cancer?   no  Are you diabetic? If yes, Type 1 or Type 2?    Type 2  Do you have a prosthetic or mechanical heart valve? no  Do you have a pacemaker/defibrillator?   no  Have you had endocarditis/atrial fibrillation? no  Have you had joint replacement within the last 12 months?  no  Do you tend to be constipated or have to use laxatives? no  Do you have any history of drugs or alcohol?  no  Do you use supplemental oxygen?  no  Have you had a stroke or heart attack within the last 6 months? no  Do you take weight loss medication?  no  Do you take any blood-thinning medications such as: (aspirin, warfarin, Plavix, Aggrenox)  no  If yes we need the name, milligram, dosage and who is prescribing doctor   Current Outpatient Medications  Medication Sig Dispense Refill   atorvastatin (LIPITOR) 40 MG tablet Take 40 mg by mouth daily.     Biotin 89999 MCG TABS Take by mouth.     Coenzyme Q10 (COQ10) 100 MG CAPS Take by mouth.     lisinopril-hydrochlorothiazide (ZESTORETIC) 20-25 MG tablet Take 1 tablet by mouth daily.     Magnesium 300 MG CAPS Take 350 mg by mouth 2 (two) times daily.     metFORMIN  (GLUCOPHAGE ) 500 MG tablet Take 1 tablet (500 mg total) by mouth 2 (two) times daily with a meal. 60 tablet 0   Semaglutide, 1 MG/DOSE, (OZEMPIC, 1 MG/DOSE,) 4 MG/3ML SOPN Inject 4 mg into the skin once a week.     UNABLE TO FIND Take 1,000 mg by mouth in the morning, at noon, in the evening, and at bedtime. Med Name: L-Argnine     No current facility-administered medications for this visit.    Allergies[1]  Pharmacy: [facility]  Primary Insurance Name: [employer] number  where you can be reached: [phone]     [1] No Known Allergies

## 2024-08-24 NOTE — Telephone Encounter (Signed)
 ATC patient to schedule TCS, no answer. LVM for call back.
